# Patient Record
Sex: Male | Born: 1960
Health system: Southern US, Community
[De-identification: ages and names within clinical notes are randomized; demographics above are authoritative.]

## PROBLEM LIST (undated history)

## (undated) DIAGNOSIS — I10 Essential (primary) hypertension: Secondary | ICD-10-CM

## (undated) DIAGNOSIS — E785 Hyperlipidemia, unspecified: Secondary | ICD-10-CM

## (undated) DIAGNOSIS — G4730 Sleep apnea, unspecified: Secondary | ICD-10-CM

## (undated) HISTORY — DX: Hyperlipidemia, unspecified: E78.5

## (undated) HISTORY — PX: WISDOM TOOTH EXTRACTION: SHX21

## (undated) HISTORY — PX: HERNIA REPAIR: SHX51

## (undated) HISTORY — PX: OTOPLASTY: SUR998

---

## 2009-04-10 ENCOUNTER — Ambulatory Visit (HOSPITAL_COMMUNITY): Admission: RE | Admit: 2009-04-10 | Discharge: 2009-04-10 | Payer: Self-pay | Admitting: General Surgery

## 2010-08-22 LAB — DIFFERENTIAL
Basophils Absolute: 0 10*3/uL (ref 0.0–0.1)
Basophils Relative: 1 % (ref 0–1)
Eosinophils Absolute: 0.1 10*3/uL (ref 0.0–0.7)
Monocytes Absolute: 0.5 10*3/uL (ref 0.1–1.0)
Neutro Abs: 5.3 10*3/uL (ref 1.7–7.7)
Neutrophils Relative %: 75 % (ref 43–77)

## 2010-08-22 LAB — BASIC METABOLIC PANEL
CO2: 25 mEq/L (ref 19–32)
Calcium: 9.2 mg/dL (ref 8.4–10.5)
Chloride: 107 mEq/L (ref 96–112)
Creatinine, Ser: 1.06 mg/dL (ref 0.4–1.5)
Glucose, Bld: 108 mg/dL — ABNORMAL HIGH (ref 70–99)

## 2010-08-22 LAB — CBC
Hemoglobin: 17.2 g/dL — ABNORMAL HIGH (ref 13.0–17.0)
MCHC: 35.1 g/dL (ref 30.0–36.0)
MCV: 89 fL (ref 78.0–100.0)
RDW: 13.3 % (ref 11.5–15.5)

## 2016-01-29 MED FILL — LOSARTAN POTASSIUM 100 MG T: 100 | 90 days supply | Qty: 90 | Fill #0

## 2016-01-31 MED FILL — AMLODIPINE BESYLATE 10 MG T: 10 | 90 days supply | Qty: 90 | Fill #0

## 2016-02-01 MED FILL — FUROSEMIDE 20 MG TABLET: 20 | 90 days supply | Qty: 90 | Fill #0

## 2016-02-09 DIAGNOSIS — E291 Testicular hypofunction: Secondary | ICD-10-CM | POA: Diagnosis not present

## 2016-02-09 DIAGNOSIS — Z1159 Encounter for screening for other viral diseases: Secondary | ICD-10-CM | POA: Diagnosis not present

## 2016-02-09 DIAGNOSIS — Z87898 Personal history of other specified conditions: Secondary | ICD-10-CM | POA: Diagnosis not present

## 2016-02-09 DIAGNOSIS — I10 Essential (primary) hypertension: Secondary | ICD-10-CM | POA: Diagnosis not present

## 2016-02-09 DIAGNOSIS — E782 Mixed hyperlipidemia: Secondary | ICD-10-CM | POA: Diagnosis not present

## 2016-02-16 DIAGNOSIS — N400 Enlarged prostate without lower urinary tract symptoms: Secondary | ICD-10-CM | POA: Diagnosis not present

## 2016-02-16 DIAGNOSIS — Z6839 Body mass index (BMI) 39.0-39.9, adult: Secondary | ICD-10-CM | POA: Diagnosis not present

## 2016-02-16 DIAGNOSIS — Z23 Encounter for immunization: Secondary | ICD-10-CM | POA: Diagnosis not present

## 2016-02-16 DIAGNOSIS — R972 Elevated prostate specific antigen [PSA]: Secondary | ICD-10-CM | POA: Diagnosis not present

## 2016-02-16 DIAGNOSIS — N529 Male erectile dysfunction, unspecified: Secondary | ICD-10-CM | POA: Diagnosis not present

## 2016-02-16 DIAGNOSIS — E782 Mixed hyperlipidemia: Secondary | ICD-10-CM | POA: Diagnosis not present

## 2016-02-16 DIAGNOSIS — R609 Edema, unspecified: Secondary | ICD-10-CM | POA: Diagnosis not present

## 2016-02-16 DIAGNOSIS — I10 Essential (primary) hypertension: Secondary | ICD-10-CM | POA: Diagnosis not present

## 2016-02-19 DIAGNOSIS — N529 Male erectile dysfunction, unspecified: Secondary | ICD-10-CM | POA: Diagnosis not present

## 2016-02-19 DIAGNOSIS — E291 Testicular hypofunction: Secondary | ICD-10-CM | POA: Diagnosis not present

## 2016-02-19 DIAGNOSIS — Z6839 Body mass index (BMI) 39.0-39.9, adult: Secondary | ICD-10-CM | POA: Diagnosis not present

## 2016-02-19 DIAGNOSIS — N401 Enlarged prostate with lower urinary tract symptoms: Secondary | ICD-10-CM | POA: Diagnosis not present

## 2016-02-19 DIAGNOSIS — N138 Other obstructive and reflux uropathy: Secondary | ICD-10-CM | POA: Diagnosis not present

## 2016-03-21 MED FILL — METOPROLOL SUCC ER 200 MG T: 200 | 90 days supply | Qty: 90 | Fill #0

## 2016-03-21 MED FILL — PRAVASTATIN NA 40 MG TAB: 40 | 90 days supply | Qty: 90 | Fill #0

## 2016-03-21 MED FILL — FINASTERIDE 5 MG TABLET: 5 | 90 days supply | Qty: 90 | Fill #0

## 2016-03-27 DIAGNOSIS — H524 Presbyopia: Secondary | ICD-10-CM | POA: Diagnosis not present

## 2016-04-25 MED FILL — AMLODIPINE BESYLATE 10 MG T: 10 | 90 days supply | Qty: 90 | Fill #0

## 2016-04-25 MED FILL — LOSARTAN POTASSIUM 100 MG T: 100 | 90 days supply | Qty: 90 | Fill #0

## 2016-04-25 MED FILL — FUROSEMIDE 20 MG TABLET: 20 | 90 days supply | Qty: 90 | Fill #0

## 2016-05-14 DIAGNOSIS — M7041 Prepatellar bursitis, right knee: Secondary | ICD-10-CM | POA: Diagnosis not present

## 2016-06-20 MED FILL — PRAVASTATIN NA 40 MG TAB: 40 | 90 days supply | Qty: 90 | Fill #0

## 2016-06-21 DIAGNOSIS — E782 Mixed hyperlipidemia: Secondary | ICD-10-CM | POA: Diagnosis not present

## 2016-06-21 DIAGNOSIS — I10 Essential (primary) hypertension: Secondary | ICD-10-CM | POA: Diagnosis not present

## 2016-07-03 MED FILL — FINASTERIDE 5 MG TABLET: 5 | 90 days supply | Qty: 90 | Fill #0

## 2016-07-05 DIAGNOSIS — R972 Elevated prostate specific antigen [PSA]: Secondary | ICD-10-CM | POA: Diagnosis not present

## 2016-07-05 DIAGNOSIS — E782 Mixed hyperlipidemia: Secondary | ICD-10-CM | POA: Diagnosis not present

## 2016-07-05 DIAGNOSIS — R600 Localized edema: Secondary | ICD-10-CM | POA: Diagnosis not present

## 2016-07-05 DIAGNOSIS — N400 Enlarged prostate without lower urinary tract symptoms: Secondary | ICD-10-CM | POA: Diagnosis not present

## 2016-07-05 DIAGNOSIS — I10 Essential (primary) hypertension: Secondary | ICD-10-CM | POA: Diagnosis not present

## 2016-07-05 DIAGNOSIS — E291 Testicular hypofunction: Secondary | ICD-10-CM | POA: Diagnosis not present

## 2016-07-05 DIAGNOSIS — N529 Male erectile dysfunction, unspecified: Secondary | ICD-10-CM | POA: Diagnosis not present

## 2016-07-05 DIAGNOSIS — Z6839 Body mass index (BMI) 39.0-39.9, adult: Secondary | ICD-10-CM | POA: Diagnosis not present

## 2016-07-05 MED FILL — METOPROLOL SUCC ER 200 MG T: 200 | 90 days supply | Qty: 90 | Fill #0

## 2016-07-12 DIAGNOSIS — L821 Other seborrheic keratosis: Secondary | ICD-10-CM | POA: Diagnosis not present

## 2016-07-12 DIAGNOSIS — D485 Neoplasm of uncertain behavior of skin: Secondary | ICD-10-CM | POA: Diagnosis not present

## 2016-07-12 DIAGNOSIS — L918 Other hypertrophic disorders of the skin: Secondary | ICD-10-CM | POA: Diagnosis not present

## 2016-07-12 DIAGNOSIS — L820 Inflamed seborrheic keratosis: Secondary | ICD-10-CM | POA: Diagnosis not present

## 2016-07-12 DIAGNOSIS — L438 Other lichen planus: Secondary | ICD-10-CM | POA: Diagnosis not present

## 2016-07-23 MED FILL — LOSARTAN POTASSIUM 100 MG T: 100 | 90 days supply | Qty: 90 | Fill #1

## 2016-07-23 MED FILL — AMLODIPINE BESYLATE 10 MG T: 10 | 90 days supply | Qty: 90 | Fill #1

## 2016-07-23 MED FILL — FUROSEMIDE 20 MG TABLET: 20 | 90 days supply | Qty: 90 | Fill #1

## 2016-08-21 DIAGNOSIS — N400 Enlarged prostate without lower urinary tract symptoms: Secondary | ICD-10-CM | POA: Diagnosis not present

## 2016-08-28 DIAGNOSIS — N138 Other obstructive and reflux uropathy: Secondary | ICD-10-CM | POA: Diagnosis not present

## 2016-08-28 DIAGNOSIS — N402 Nodular prostate without lower urinary tract symptoms: Secondary | ICD-10-CM | POA: Diagnosis not present

## 2016-08-28 DIAGNOSIS — N401 Enlarged prostate with lower urinary tract symptoms: Secondary | ICD-10-CM | POA: Diagnosis not present

## 2016-08-28 DIAGNOSIS — E291 Testicular hypofunction: Secondary | ICD-10-CM | POA: Diagnosis not present

## 2016-09-18 MED FILL — PRAVASTATIN NA 40 MG TAB: 40 | 90 days supply | Qty: 90 | Fill #1

## 2016-10-03 MED FILL — FINASTERIDE 5 MG TABLET: 5 | 90 days supply | Qty: 90 | Fill #1

## 2016-10-03 MED FILL — METOPROLOL SUCC ER 200 MG T: 200 | 90 days supply | Qty: 90 | Fill #1

## 2016-10-22 MED FILL — FUROSEMIDE 20 MG TABLET: 20 | 90 days supply | Qty: 90 | Fill #0

## 2016-10-22 MED FILL — LOSARTAN POTASSIUM 100 MG T: 100 | 90 days supply | Qty: 90 | Fill #0

## 2016-10-22 MED FILL — AMLODIPINE BESYLATE 10 MG T: 10 | 90 days supply | Qty: 90 | Fill #0

## 2016-11-19 DIAGNOSIS — E782 Mixed hyperlipidemia: Secondary | ICD-10-CM | POA: Diagnosis not present

## 2016-11-19 DIAGNOSIS — I10 Essential (primary) hypertension: Secondary | ICD-10-CM | POA: Diagnosis not present

## 2016-11-25 DIAGNOSIS — R972 Elevated prostate specific antigen [PSA]: Secondary | ICD-10-CM | POA: Diagnosis not present

## 2016-11-25 DIAGNOSIS — E782 Mixed hyperlipidemia: Secondary | ICD-10-CM | POA: Diagnosis not present

## 2016-11-25 DIAGNOSIS — R600 Localized edema: Secondary | ICD-10-CM | POA: Diagnosis not present

## 2016-11-25 DIAGNOSIS — I10 Essential (primary) hypertension: Secondary | ICD-10-CM | POA: Diagnosis not present

## 2016-11-25 DIAGNOSIS — H6980 Other specified disorders of Eustachian tube, unspecified ear: Secondary | ICD-10-CM | POA: Diagnosis not present

## 2016-11-25 DIAGNOSIS — D751 Secondary polycythemia: Secondary | ICD-10-CM | POA: Diagnosis not present

## 2016-11-25 DIAGNOSIS — N400 Enlarged prostate without lower urinary tract symptoms: Secondary | ICD-10-CM | POA: Diagnosis not present

## 2016-11-25 DIAGNOSIS — H6591 Unspecified nonsuppurative otitis media, right ear: Secondary | ICD-10-CM | POA: Diagnosis not present

## 2016-11-25 MED FILL — AMOX TR-K CLV 875-125 MG TA: 875-125 | 10 days supply | Qty: 20 | Fill #0

## 2016-12-12 ENCOUNTER — Encounter (HOSPITAL_COMMUNITY): Payer: Self-pay | Admitting: *Deleted

## 2016-12-12 ENCOUNTER — Ambulatory Visit (HOSPITAL_COMMUNITY)
Admission: EM | Admit: 2016-12-12 | Discharge: 2016-12-12 | Disposition: A | Discharge status: Home / Self Care | Payer: 59

## 2016-12-12 DIAGNOSIS — H6691 Otitis media, unspecified, right ear: Secondary | ICD-10-CM | POA: Diagnosis not present

## 2016-12-12 HISTORY — DX: Essential (primary) hypertension: I10

## 2016-12-12 MED ORDER — CEFTRIAXONE SODIUM 1 G IJ SOLR
1.0000 g | Freq: Once | INTRAMUSCULAR | Status: AC
  Administered 2016-12-12: 1 g via INTRAMUSCULAR

## 2016-12-12 MED ORDER — LIDOCAINE HCL (PF) 1 % IJ SOLN
INTRAMUSCULAR | Status: AC
  Filled 2016-12-12: qty 2

## 2016-12-12 MED ORDER — CEFDINIR 300 MG PO CAPS: 300.0000 mg | ORAL_CAPSULE | Freq: Two times a day (BID) | ORAL | 0 refills | Status: AC

## 2016-12-12 MED ORDER — CEFTRIAXONE SODIUM 1 G IJ SOLR
INTRAMUSCULAR | Status: AC
  Filled 2016-12-12: qty 10

## 2016-12-12 NOTE — ED Provider Notes (Signed)
CSN: [id number]     Arrival date & time 12/12/16  1610 History   First MD Initiated Contact with Patient 12/12/16 1647     Chief Complaint  Patient presents with  . Otalgia   (Consider location/radiation/quality/duration/timing/severity/associated sxs/prior Treatment) Subjective:   Richard Farley is a 56 y.o. male who presents for possible ear infection. Symptoms include right ear pain. Onset of symptoms was 6 weeks ago and has been unchanged since that time. Associated symptoms include generalized achiness and headache. He is drinking plenty of fluids. Notably, the patient was evaluated by his PCP several weeks ago for the same. He has completed a course of Augmentin about 2 weeks ago. The patient's symptoms still persist. He was referred to an ENT specialist but his appointment isn't until August 13. He denies any fevers, sweats, chills, rhinorrhea, congestion or sore throat.   The following portions of the patient's history were reviewed and updated as appropriate: allergies, current medications, past family history, past medical history, past social history, past surgical history and problem list.          Past Medical History:  Diagnosis Date  . Hypertension    Past Surgical History:  Procedure Laterality Date  . HERNIA REPAIR     History reviewed. No pertinent family history. Social History  Substance Use Topics  . Smoking status: Never Smoker  . Smokeless tobacco: Never Used  . Alcohol use Yes    Review of Systems  Constitutional: Negative for chills, diaphoresis and fever.  HENT: Positive for ear pain. Negative for ear discharge.   Gastrointestinal: Negative for nausea and vomiting.  Neurological: Positive for headaches.  All other systems reviewed and are negative.   Allergies  Crestor [rosuvastatin calcium]  Home Medications   Prior to Admission medications   Medication Sig Start Date End Date Taking? Authorizing Provider  AMLODIPINE BESYLATE PO Take  by mouth.   Yes [provider]  Furosemide (LASIX PO) Take by mouth.   Yes [provider]  LOSARTAN POTASSIUM PO Take by mouth.   Yes [provider]  cefdinir (OMNICEF) 300 MG capsule Take 1 capsule (300 mg total) by mouth 2 (two) times daily. 12/12/16 12/22/16  Enrique Sack, FNP   Meds Ordered and Administered this Visit   Medications  cefTRIAXone (ROCEPHIN) injection 1 g (not administered)    BP (!) 152/90 (BP Location: Right Arm)   Pulse 60   Temp 98.8 F (37.1 C) (Oral)   Resp 16   SpO2 96%  No data found.   Physical Exam  Constitutional: He is oriented to person, place, and time. He appears well-developed and well-nourished.  HENT:  Head: Normocephalic.  Left Ear: External ear normal.  Nose: Nose normal.  Mouth/Throat: Oropharynx is clear and moist. No oropharyngeal exudate.  External auditory canals.Tympanic membrane redness present with poor mobility. Hearing grossly intact.   Eyes: Pupils are equal, round, and reactive to light. Conjunctivae and EOM are normal.  Neck: Normal range of motion. Neck supple.  Cardiovascular: Normal rate and regular rhythm.   Pulmonary/Chest: Effort normal and breath sounds normal.  Musculoskeletal: Normal range of motion.  Lymphadenopathy:    He has no cervical adenopathy.  Neurological: He is alert and oriented to person, place, and time.  Skin: Skin is warm and dry.  Psychiatric: He has a normal mood and affect.    Urgent Care Course     Procedures (including critical care time)  Labs Review Labs Reviewed - No data to display  Imaging Review No results found.   Visual Acuity Review  Right Eye Distance:   Left Eye Distance:   Bilateral Distance:    Right Eye Near:   Left Eye Near:    Bilateral Near:         MDM   1. Otitis media not resolved, right    Plan: 1. 1 gram of Rocephin IM in the office today  2. Start Cefdinir 300 mg BID x 10 days  3. Take OTC probiotic while on  antibiotics  4. OTC analgesics, fluids and rest.  5.  Follow-up with the ENT specialist on August 13 as already scheduled.   Discussed diagnosis and treatment with patient. All questions have been answered and all concerns have been addressed. The patient verbalized understanding and had no further questions     Enrique Sack, [place] 12/12/16 1706

## 2016-12-12 NOTE — ED Triage Notes (Signed)
Pt    Reports  r  Earache  X   6   Weeks     Took  A   course  Of  augmentin  And  Did  Not   Work   Also  Reports  Body  Aches  And  Headache   With  Some  Fatigue

## 2016-12-12 NOTE — Discharge Instructions (Signed)
You were given an antibiotic injection in the clinic today. You should start your oral antibiotics in the morning. Take this twice daily for 10 days. You should also pick up a probiotic (of your choice) from the pharmacy and take as directed on the label while you are on the antibiotics. You can take tylenol or ibuprofen as needed for pain or fever. Follow-up with the ENT specialist on August 13 as already scheduled.

## 2016-12-20 DIAGNOSIS — G4733 Obstructive sleep apnea (adult) (pediatric): Secondary | ICD-10-CM | POA: Diagnosis not present

## 2016-12-20 MED FILL — PRAVASTATIN NA 40 MG TAB: 40 | 90 days supply | Qty: 90 | Fill #0

## 2016-12-26 MED FILL — FINASTERIDE 5 MG TABLET: 5 | 90 days supply | Qty: 90 | Fill #2

## 2016-12-30 DIAGNOSIS — Z9989 Dependence on other enabling machines and devices: Secondary | ICD-10-CM | POA: Diagnosis not present

## 2016-12-30 DIAGNOSIS — H6501 Acute serous otitis media, right ear: Secondary | ICD-10-CM | POA: Diagnosis not present

## 2016-12-30 DIAGNOSIS — Z6841 Body Mass Index (BMI) 40.0 and over, adult: Secondary | ICD-10-CM | POA: Diagnosis not present

## 2016-12-30 DIAGNOSIS — H6981 Other specified disorders of Eustachian tube, right ear: Secondary | ICD-10-CM | POA: Diagnosis not present

## 2016-12-30 DIAGNOSIS — G4733 Obstructive sleep apnea (adult) (pediatric): Secondary | ICD-10-CM | POA: Diagnosis not present

## 2016-12-30 MED FILL — predniSONE 20 MG TABS: 20 | 13 days supply | Qty: 30 | Fill #0

## 2016-12-30 MED FILL — FLUTICASONE PROP 50 MCG SPR: 50 | 30 days supply | Qty: 16 | Fill #0

## 2016-12-31 MED FILL — METOPROLOL SUCC ER 200 MG T: 200 | 90 days supply | Qty: 90 | Fill #0

## 2017-01-08 DIAGNOSIS — G4733 Obstructive sleep apnea (adult) (pediatric): Secondary | ICD-10-CM | POA: Diagnosis not present

## 2017-01-13 DIAGNOSIS — H6981 Other specified disorders of Eustachian tube, right ear: Secondary | ICD-10-CM | POA: Diagnosis not present

## 2017-01-13 DIAGNOSIS — H6521 Chronic serous otitis media, right ear: Secondary | ICD-10-CM | POA: Diagnosis not present

## 2017-01-13 HISTORY — PX: TYMPANOSTOMY TUBE PLACEMENT: SHX32

## 2017-01-13 MED FILL — NYSTATIN 100,000 UNITS/ML S: 100000 | 7 days supply | Qty: 105 | Fill #0

## 2017-01-13 MED FILL — OFLOXACIN 0.3% EYE DROPS: 0.3 | 7 days supply | Qty: 10 | Fill #0

## 2017-01-21 MED FILL — AMLODIPINE BESYLATE 10 MG T: 10 | 90 days supply | Qty: 90 | Fill #1

## 2017-01-21 MED FILL — FUROSEMIDE 20 MG TAB: 20 | 90 days supply | Qty: 90 | Fill #1

## 2017-01-21 MED FILL — LOSARTAN POTASSIUM 100 MG T: 100 | 90 days supply | Qty: 90 | Fill #1

## 2017-01-28 DIAGNOSIS — H65111 Acute and subacute allergic otitis media (mucoid) (sanguinous) (serous), right ear: Secondary | ICD-10-CM | POA: Diagnosis not present

## 2017-01-29 MED FILL — NEOMYCIN-POLYMYXIN-HC EAR S: 3.5-10000-1 | 7 days supply | Qty: 10 | Fill #0

## 2017-02-08 DIAGNOSIS — G4733 Obstructive sleep apnea (adult) (pediatric): Secondary | ICD-10-CM | POA: Diagnosis not present

## 2017-02-18 DIAGNOSIS — Z9989 Dependence on other enabling machines and devices: Secondary | ICD-10-CM | POA: Diagnosis not present

## 2017-02-18 DIAGNOSIS — G4733 Obstructive sleep apnea (adult) (pediatric): Secondary | ICD-10-CM | POA: Diagnosis not present

## 2017-02-18 DIAGNOSIS — H6981 Other specified disorders of Eustachian tube, right ear: Secondary | ICD-10-CM | POA: Diagnosis not present

## 2017-03-10 DIAGNOSIS — G4733 Obstructive sleep apnea (adult) (pediatric): Secondary | ICD-10-CM | POA: Diagnosis not present

## 2017-03-24 MED FILL — FINASTERIDE 5 MG TABLET: 5 | 90 days supply | Qty: 90 | Fill #3

## 2017-03-24 MED FILL — PRAVASTATIN NA 40 MG TAB: 40 | 90 days supply | Qty: 90 | Fill #1

## 2017-03-28 DIAGNOSIS — E782 Mixed hyperlipidemia: Secondary | ICD-10-CM | POA: Diagnosis not present

## 2017-03-28 DIAGNOSIS — E291 Testicular hypofunction: Secondary | ICD-10-CM | POA: Diagnosis not present

## 2017-03-28 DIAGNOSIS — I10 Essential (primary) hypertension: Secondary | ICD-10-CM | POA: Diagnosis not present

## 2017-03-31 DIAGNOSIS — E291 Testicular hypofunction: Secondary | ICD-10-CM | POA: Diagnosis not present

## 2017-03-31 DIAGNOSIS — N400 Enlarged prostate without lower urinary tract symptoms: Secondary | ICD-10-CM | POA: Diagnosis not present

## 2017-03-31 DIAGNOSIS — N529 Male erectile dysfunction, unspecified: Secondary | ICD-10-CM | POA: Diagnosis not present

## 2017-04-03 MED FILL — METOPROLOL SUCC ER 200 MG T: 200 | 90 days supply | Qty: 90 | Fill #1

## 2017-04-04 DIAGNOSIS — G4733 Obstructive sleep apnea (adult) (pediatric): Secondary | ICD-10-CM | POA: Diagnosis not present

## 2017-04-04 DIAGNOSIS — Z23 Encounter for immunization: Secondary | ICD-10-CM | POA: Diagnosis not present

## 2017-04-04 DIAGNOSIS — N400 Enlarged prostate without lower urinary tract symptoms: Secondary | ICD-10-CM | POA: Diagnosis not present

## 2017-04-04 DIAGNOSIS — R600 Localized edema: Secondary | ICD-10-CM | POA: Diagnosis not present

## 2017-04-04 DIAGNOSIS — Z9989 Dependence on other enabling machines and devices: Secondary | ICD-10-CM | POA: Diagnosis not present

## 2017-04-04 DIAGNOSIS — E785 Hyperlipidemia, unspecified: Secondary | ICD-10-CM | POA: Diagnosis not present

## 2017-04-04 DIAGNOSIS — I10 Essential (primary) hypertension: Secondary | ICD-10-CM | POA: Diagnosis not present

## 2017-04-04 DIAGNOSIS — R972 Elevated prostate specific antigen [PSA]: Secondary | ICD-10-CM | POA: Diagnosis not present

## 2017-04-10 DIAGNOSIS — G4733 Obstructive sleep apnea (adult) (pediatric): Secondary | ICD-10-CM | POA: Diagnosis not present

## 2017-04-14 DIAGNOSIS — G4733 Obstructive sleep apnea (adult) (pediatric): Secondary | ICD-10-CM | POA: Diagnosis not present

## 2017-04-22 MED FILL — LOSARTAN POTASSIUM 100 MG T: 100 | 90 days supply | Qty: 90 | Fill #0

## 2017-04-22 MED FILL — AMLODIPINE BESYLATE 10 MG T: 10 | 90 days supply | Qty: 90 | Fill #0

## 2017-04-22 MED FILL — FUROSEMIDE 20 MG TABLET: 20 | 90 days supply | Qty: 90 | Fill #0

## 2017-07-01 MED FILL — METOPROLOL SUCC ER 200 MG T: 200 | 90 days supply | Qty: 90 | Fill #0

## 2017-07-01 MED FILL — PRAVASTATIN NA 40 MG TAB: 40 | 90 days supply | Qty: 90 | Fill #0

## 2017-07-01 MED FILL — FINASTERIDE 5 MG TABLET: 5 | 90 days supply | Qty: 90 | Fill #0

## 2017-07-22 MED FILL — FUROSEMIDE 20 MG TABS: 20 | 90 days supply | Qty: 90 | Fill #1

## 2017-07-22 MED FILL — LOSARTAN POTASSIUM 100 MG T: 100 | 90 days supply | Qty: 90 | Fill #1

## 2017-07-22 MED FILL — AMLODIPINE BESYLATE 10 MG T: 10 | 90 days supply | Qty: 90 | Fill #1

## 2017-07-23 DIAGNOSIS — G4733 Obstructive sleep apnea (adult) (pediatric): Secondary | ICD-10-CM | POA: Diagnosis not present

## 2017-08-08 DIAGNOSIS — E782 Mixed hyperlipidemia: Secondary | ICD-10-CM | POA: Diagnosis not present

## 2017-08-08 DIAGNOSIS — I10 Essential (primary) hypertension: Secondary | ICD-10-CM | POA: Diagnosis not present

## 2017-08-11 MED FILL — POTASSIUM CL ER 10 MEQ TABL: 10 | 90 days supply | Qty: 90 | Fill #0

## 2017-08-13 DIAGNOSIS — R972 Elevated prostate specific antigen [PSA]: Secondary | ICD-10-CM | POA: Diagnosis not present

## 2017-08-13 DIAGNOSIS — N529 Male erectile dysfunction, unspecified: Secondary | ICD-10-CM | POA: Diagnosis not present

## 2017-08-13 DIAGNOSIS — I872 Venous insufficiency (chronic) (peripheral): Secondary | ICD-10-CM | POA: Diagnosis not present

## 2017-08-13 DIAGNOSIS — I10 Essential (primary) hypertension: Secondary | ICD-10-CM | POA: Diagnosis not present

## 2017-08-13 DIAGNOSIS — E782 Mixed hyperlipidemia: Secondary | ICD-10-CM | POA: Diagnosis not present

## 2017-08-13 DIAGNOSIS — E291 Testicular hypofunction: Secondary | ICD-10-CM | POA: Diagnosis not present

## 2017-08-13 DIAGNOSIS — N400 Enlarged prostate without lower urinary tract symptoms: Secondary | ICD-10-CM | POA: Diagnosis not present

## 2017-08-13 DIAGNOSIS — E876 Hypokalemia: Secondary | ICD-10-CM | POA: Diagnosis not present

## 2017-08-20 DIAGNOSIS — H6981 Other specified disorders of Eustachian tube, right ear: Secondary | ICD-10-CM | POA: Diagnosis not present

## 2017-08-20 DIAGNOSIS — H6504 Acute serous otitis media, recurrent, right ear: Secondary | ICD-10-CM | POA: Diagnosis not present

## 2017-08-20 MED FILL — predniSONE 20 MG TABS: 20 | 13 days supply | Qty: 30 | Fill #0

## 2017-08-21 MED FILL — FLUTICASONE PROP 50 MCG SPR: 50 | 30 days supply | Qty: 16 | Fill #1

## 2017-09-10 DIAGNOSIS — G4733 Obstructive sleep apnea (adult) (pediatric): Secondary | ICD-10-CM | POA: Diagnosis not present

## 2017-09-11 MED FILL — NEO/POLYMYXIN/HC EAR SOLN: 3.5-10000-1 | 25 days supply | Qty: 10 | Fill #0

## 2017-09-22 DIAGNOSIS — E291 Testicular hypofunction: Secondary | ICD-10-CM | POA: Diagnosis not present

## 2017-09-22 DIAGNOSIS — N400 Enlarged prostate without lower urinary tract symptoms: Secondary | ICD-10-CM | POA: Diagnosis not present

## 2017-09-29 DIAGNOSIS — E291 Testicular hypofunction: Secondary | ICD-10-CM | POA: Diagnosis not present

## 2017-09-29 DIAGNOSIS — R972 Elevated prostate specific antigen [PSA]: Secondary | ICD-10-CM | POA: Diagnosis not present

## 2017-09-29 DIAGNOSIS — N400 Enlarged prostate without lower urinary tract symptoms: Secondary | ICD-10-CM | POA: Diagnosis not present

## 2017-09-30 MED FILL — METOPROLOL SUCCINATE ER 200: 200 | 90 days supply | Qty: 90 | Fill #0

## 2017-09-30 MED FILL — FINASTERIDE 5 MG TABLET: 5 | 90 days supply | Qty: 90 | Fill #1

## 2017-09-30 MED FILL — PRAVASTATIN NA 40 MG TAB: 40 | 90 days supply | Qty: 90 | Fill #0

## 2017-10-03 DIAGNOSIS — R972 Elevated prostate specific antigen [PSA]: Secondary | ICD-10-CM | POA: Diagnosis not present

## 2017-10-09 DIAGNOSIS — E291 Testicular hypofunction: Secondary | ICD-10-CM | POA: Diagnosis not present

## 2017-10-09 DIAGNOSIS — R972 Elevated prostate specific antigen [PSA]: Secondary | ICD-10-CM | POA: Diagnosis not present

## 2017-10-09 DIAGNOSIS — N400 Enlarged prostate without lower urinary tract symptoms: Secondary | ICD-10-CM | POA: Diagnosis not present

## 2017-10-20 MED FILL — LOSARTAN POTASSIUM 100 MG T: 100 | 90 days supply | Qty: 90 | Fill #0

## 2017-10-20 MED FILL — AMLODIPINE BESYLATE 10 MG T: 10 | 90 days supply | Qty: 90 | Fill #0

## 2017-10-20 MED FILL — FUROSEMIDE 20 MG TABS: 20 | 90 days supply | Qty: 90 | Fill #0

## 2017-11-24 MED FILL — POTASSIUM CL ER 10 MEQ TABL: 10 | 90 days supply | Qty: 90 | Fill #1

## 2017-12-23 DIAGNOSIS — H524 Presbyopia: Secondary | ICD-10-CM | POA: Diagnosis not present

## 2017-12-25 MED FILL — FINASTERIDE 5 MG TABLET: 5 | 90 days supply | Qty: 90 | Fill #2

## 2017-12-25 MED FILL — METOPROLOL SUCCINATE ER 200: 200 | 90 days supply | Qty: 90 | Fill #0

## 2017-12-25 MED FILL — PRAVASTATIN NA 40 MG TAB: 40 | 90 days supply | Qty: 90 | Fill #0

## 2018-01-21 MED FILL — LOSARTAN POTASSIUM 100 MG T: 100 | 30 days supply | Qty: 30 | Fill #1

## 2018-01-21 MED FILL — AMLODIPINE BESYLATE 10 MG T: 10 | 90 days supply | Qty: 90 | Fill #1

## 2018-01-21 MED FILL — FUROSEMIDE 20 MG TABS: 20 | 90 days supply | Qty: 90 | Fill #1

## 2018-01-26 DIAGNOSIS — E782 Mixed hyperlipidemia: Secondary | ICD-10-CM | POA: Diagnosis not present

## 2018-01-26 DIAGNOSIS — I10 Essential (primary) hypertension: Secondary | ICD-10-CM | POA: Diagnosis not present

## 2018-02-02 DIAGNOSIS — I10 Essential (primary) hypertension: Secondary | ICD-10-CM | POA: Diagnosis not present

## 2018-02-02 DIAGNOSIS — E291 Testicular hypofunction: Secondary | ICD-10-CM | POA: Diagnosis not present

## 2018-02-02 DIAGNOSIS — N139 Obstructive and reflux uropathy, unspecified: Secondary | ICD-10-CM | POA: Diagnosis not present

## 2018-02-02 DIAGNOSIS — N529 Male erectile dysfunction, unspecified: Secondary | ICD-10-CM | POA: Diagnosis not present

## 2018-02-02 DIAGNOSIS — G4733 Obstructive sleep apnea (adult) (pediatric): Secondary | ICD-10-CM | POA: Diagnosis not present

## 2018-02-02 DIAGNOSIS — E785 Hyperlipidemia, unspecified: Secondary | ICD-10-CM | POA: Diagnosis not present

## 2018-02-02 DIAGNOSIS — Z23 Encounter for immunization: Secondary | ICD-10-CM | POA: Diagnosis not present

## 2018-02-02 DIAGNOSIS — N401 Enlarged prostate with lower urinary tract symptoms: Secondary | ICD-10-CM | POA: Diagnosis not present

## 2018-02-02 MED FILL — POTASSIUM CL ER 20 MEQ TABL: 20 | 90 days supply | Qty: 90 | Fill #0

## 2018-02-11 DIAGNOSIS — E291 Testicular hypofunction: Secondary | ICD-10-CM | POA: Diagnosis not present

## 2018-02-11 DIAGNOSIS — R972 Elevated prostate specific antigen [PSA]: Secondary | ICD-10-CM | POA: Diagnosis not present

## 2018-02-19 MED FILL — LOSARTAN POTASSIUM 100 MG T: 100 | 30 days supply | Qty: 30 | Fill #2

## 2018-02-25 DIAGNOSIS — E291 Testicular hypofunction: Secondary | ICD-10-CM | POA: Diagnosis not present

## 2018-02-25 DIAGNOSIS — R972 Elevated prostate specific antigen [PSA]: Secondary | ICD-10-CM | POA: Diagnosis not present

## 2018-02-25 DIAGNOSIS — N400 Enlarged prostate without lower urinary tract symptoms: Secondary | ICD-10-CM | POA: Diagnosis not present

## 2018-03-18 DIAGNOSIS — G4733 Obstructive sleep apnea (adult) (pediatric): Secondary | ICD-10-CM | POA: Diagnosis not present

## 2018-03-23 MED FILL — FINASTERIDE 5 MG TABLET: 5 | 90 days supply | Qty: 90 | Fill #3

## 2018-03-23 MED FILL — LOSARTAN POTASSIUM 100 MG T: 100 | 30 days supply | Qty: 30 | Fill #3

## 2018-03-23 MED FILL — PRAVASTATIN NA 40 MG TAB: 40 | 90 days supply | Qty: 90 | Fill #1

## 2018-03-24 MED FILL — METOPROLOL SUCCINATE ER 200: 200 | 90 days supply | Qty: 90 | Fill #1

## 2018-04-15 MED FILL — AMLODIPINE BESYLATE 10 MG T: 10 | 90 days supply | Qty: 90 | Fill #0

## 2018-04-15 MED FILL — FUROSEMIDE 20 MG TABS: 20 | 90 days supply | Qty: 90 | Fill #0

## 2018-04-17 MED FILL — LOSARTAN POTASSIUM 100 MG T: 100 | 30 days supply | Qty: 30 | Fill #0

## 2018-05-18 MED FILL — LOSARTAN POTASSIUM 100 MG T: 100 | 30 days supply | Qty: 30 | Fill #1

## 2018-05-18 MED FILL — POTASSIUM CHLORIDE CRYS ER: 20 | 90 days supply | Qty: 90 | Fill #1

## 2018-05-28 DIAGNOSIS — G4733 Obstructive sleep apnea (adult) (pediatric): Secondary | ICD-10-CM | POA: Diagnosis not present

## 2018-06-05 DIAGNOSIS — Z131 Encounter for screening for diabetes mellitus: Secondary | ICD-10-CM | POA: Diagnosis not present

## 2018-06-05 DIAGNOSIS — Z13220 Encounter for screening for lipoid disorders: Secondary | ICD-10-CM | POA: Diagnosis not present

## 2018-06-05 DIAGNOSIS — E782 Mixed hyperlipidemia: Secondary | ICD-10-CM | POA: Diagnosis not present

## 2018-06-12 DIAGNOSIS — E782 Mixed hyperlipidemia: Secondary | ICD-10-CM | POA: Diagnosis not present

## 2018-06-12 DIAGNOSIS — D751 Secondary polycythemia: Secondary | ICD-10-CM | POA: Diagnosis not present

## 2018-06-12 DIAGNOSIS — Z0000 Encounter for general adult medical examination without abnormal findings: Secondary | ICD-10-CM | POA: Diagnosis not present

## 2018-06-12 DIAGNOSIS — E876 Hypokalemia: Secondary | ICD-10-CM | POA: Diagnosis not present

## 2018-06-12 DIAGNOSIS — I10 Essential (primary) hypertension: Secondary | ICD-10-CM | POA: Diagnosis not present

## 2018-06-12 DIAGNOSIS — N529 Male erectile dysfunction, unspecified: Secondary | ICD-10-CM | POA: Diagnosis not present

## 2018-06-12 DIAGNOSIS — N400 Enlarged prostate without lower urinary tract symptoms: Secondary | ICD-10-CM | POA: Diagnosis not present

## 2018-06-12 DIAGNOSIS — G4733 Obstructive sleep apnea (adult) (pediatric): Secondary | ICD-10-CM | POA: Diagnosis not present

## 2018-06-12 DIAGNOSIS — E291 Testicular hypofunction: Secondary | ICD-10-CM | POA: Diagnosis not present

## 2018-06-12 DIAGNOSIS — I872 Venous insufficiency (chronic) (peripheral): Secondary | ICD-10-CM | POA: Diagnosis not present

## 2018-06-22 MED FILL — LOSARTAN POTASSIUM 100 MG T: 100 | 30 days supply | Qty: 30 | Fill #2

## 2018-06-29 MED FILL — PRAVASTATIN NA 40 MG TAB: 40 | 90 days supply | Qty: 90 | Fill #0

## 2018-06-29 MED FILL — METOPROLOL SUCCINATE ER 200: 200 | 90 days supply | Qty: 90 | Fill #0

## 2018-06-29 MED FILL — FINASTERIDE 5 MG TABLET: 5 | 90 days supply | Qty: 90 | Fill #0

## 2018-07-21 MED FILL — FUROSEMIDE 20 MG TABS: 20 | 90 days supply | Qty: 90 | Fill #1

## 2018-07-21 MED FILL — LOSARTAN POTASSIUM 100 MG T: 100 | 30 days supply | Qty: 30 | Fill #3

## 2018-07-21 MED FILL — AMLODIPINE BESYLATE 10 MG T: 10 | 90 days supply | Qty: 90 | Fill #1

## 2018-08-14 MED FILL — POTASSIUM CHLORIDE CRYS ER: 20 | 90 days supply | Qty: 90 | Fill #0

## 2018-08-14 MED FILL — LOSARTAN POTASSIUM 100 MG T: 100 | 30 days supply | Qty: 30 | Fill #4

## 2018-09-17 MED FILL — LOSARTAN POTASSIUM 100 MG T: 100 | 30 days supply | Qty: 30 | Fill #5

## 2018-09-23 MED FILL — PRAVASTATIN NA 40 MG TAB: 40 | 90 days supply | Qty: 90 | Fill #1

## 2018-09-23 MED FILL — FINASTERIDE 5 MG TABLET: 5 | 90 days supply | Qty: 90 | Fill #1

## 2018-09-23 MED FILL — METOPROLOL SUCCINATE ER 200: 200 | 90 days supply | Qty: 90 | Fill #1

## 2018-10-01 DIAGNOSIS — R739 Hyperglycemia, unspecified: Secondary | ICD-10-CM | POA: Diagnosis not present

## 2018-10-01 DIAGNOSIS — I10 Essential (primary) hypertension: Secondary | ICD-10-CM | POA: Diagnosis not present

## 2018-10-01 DIAGNOSIS — E782 Mixed hyperlipidemia: Secondary | ICD-10-CM | POA: Diagnosis not present

## 2018-10-07 DIAGNOSIS — N400 Enlarged prostate without lower urinary tract symptoms: Secondary | ICD-10-CM | POA: Diagnosis not present

## 2018-10-07 DIAGNOSIS — E782 Mixed hyperlipidemia: Secondary | ICD-10-CM | POA: Diagnosis not present

## 2018-10-07 DIAGNOSIS — I10 Essential (primary) hypertension: Secondary | ICD-10-CM | POA: Diagnosis not present

## 2018-10-07 DIAGNOSIS — G4733 Obstructive sleep apnea (adult) (pediatric): Secondary | ICD-10-CM | POA: Diagnosis not present

## 2018-10-07 DIAGNOSIS — R739 Hyperglycemia, unspecified: Secondary | ICD-10-CM | POA: Diagnosis not present

## 2018-10-07 DIAGNOSIS — E291 Testicular hypofunction: Secondary | ICD-10-CM | POA: Diagnosis not present

## 2018-10-07 DIAGNOSIS — R972 Elevated prostate specific antigen [PSA]: Secondary | ICD-10-CM | POA: Diagnosis not present

## 2018-10-07 DIAGNOSIS — N529 Male erectile dysfunction, unspecified: Secondary | ICD-10-CM | POA: Diagnosis not present

## 2018-10-08 DIAGNOSIS — G4733 Obstructive sleep apnea (adult) (pediatric): Secondary | ICD-10-CM | POA: Diagnosis not present

## 2018-10-14 MED FILL — FUROSEMIDE 20 MG TABS: 20 | 90 days supply | Qty: 90 | Fill #0

## 2018-10-14 MED FILL — AMLODIPINE BESYLATE 10 MG T: 10 | 90 days supply | Qty: 90 | Fill #0

## 2018-10-14 MED FILL — LOSARTAN POTASSIUM 100 MG T: 100 | 30 days supply | Qty: 30 | Fill #0

## 2018-10-23 DIAGNOSIS — R609 Edema, unspecified: Secondary | ICD-10-CM | POA: Diagnosis not present

## 2018-10-23 DIAGNOSIS — I872 Venous insufficiency (chronic) (peripheral): Secondary | ICD-10-CM | POA: Diagnosis not present

## 2018-10-23 MED FILL — TRIAMCINOLONE 0.1% CREAM: 0.1 | 14 days supply | Qty: 80 | Fill #0

## 2018-11-03 MED FILL — TRIAMCINOLONE 0.1% CREAM: 0.1 | 14 days supply | Qty: 80 | Fill #1

## 2018-11-07 MED FILL — LOSARTAN POTASSIUM 100 MG T: 100 | 30 days supply | Qty: 30 | Fill #1

## 2018-11-09 MED FILL — POTASSIUM CHLORIDE CRYS ER: 20 | 90 days supply | Qty: 90 | Fill #1

## 2018-11-10 DIAGNOSIS — I8312 Varicose veins of left lower extremity with inflammation: Secondary | ICD-10-CM | POA: Diagnosis not present

## 2018-11-10 DIAGNOSIS — I8311 Varicose veins of right lower extremity with inflammation: Secondary | ICD-10-CM | POA: Diagnosis not present

## 2018-11-10 DIAGNOSIS — R600 Localized edema: Secondary | ICD-10-CM | POA: Diagnosis not present

## 2018-11-10 DIAGNOSIS — I8002 Phlebitis and thrombophlebitis of superficial vessels of left lower extremity: Secondary | ICD-10-CM | POA: Diagnosis not present

## 2018-11-18 DIAGNOSIS — G4733 Obstructive sleep apnea (adult) (pediatric): Secondary | ICD-10-CM | POA: Diagnosis not present

## 2018-12-02 DIAGNOSIS — I8002 Phlebitis and thrombophlebitis of superficial vessels of left lower extremity: Secondary | ICD-10-CM | POA: Diagnosis not present

## 2018-12-02 DIAGNOSIS — R600 Localized edema: Secondary | ICD-10-CM | POA: Diagnosis not present

## 2018-12-07 MED FILL — LOSARTAN POTASSIUM 100 MG T: 100 | 30 days supply | Qty: 30 | Fill #2

## 2018-12-09 DIAGNOSIS — I8312 Varicose veins of left lower extremity with inflammation: Secondary | ICD-10-CM | POA: Diagnosis not present

## 2018-12-20 MED FILL — FINASTERIDE 5 MG TABLET: 5 | 90 days supply | Qty: 90 | Fill #2

## 2018-12-21 MED FILL — PRAVASTATIN NA 40 MG TAB: 40 | 90 days supply | Qty: 90 | Fill #0

## 2018-12-21 MED FILL — METOPROLOL SUCCINATE ER 200: 200 | 90 days supply | Qty: 90 | Fill #0

## 2018-12-23 DIAGNOSIS — I8312 Varicose veins of left lower extremity with inflammation: Secondary | ICD-10-CM | POA: Diagnosis not present

## 2018-12-23 DIAGNOSIS — I8002 Phlebitis and thrombophlebitis of superficial vessels of left lower extremity: Secondary | ICD-10-CM | POA: Diagnosis not present

## 2018-12-23 DIAGNOSIS — R600 Localized edema: Secondary | ICD-10-CM | POA: Diagnosis not present

## 2019-01-05 MED FILL — ALPRAZolam 0.5 MG TABS: 0.5 | 4 days supply | Qty: 4 | Fill #0

## 2019-01-06 MED FILL — AMLODIPINE BESYLATE 10 MG T: 10 | 90 days supply | Qty: 90 | Fill #1

## 2019-01-06 MED FILL — FUROSEMIDE 20 MG TABS: 20 | 90 days supply | Qty: 90 | Fill #1

## 2019-01-06 MED FILL — LOSARTAN POTASSIUM 100 MG T: 100 | 30 days supply | Qty: 30 | Fill #3

## 2019-01-12 DIAGNOSIS — I8311 Varicose veins of right lower extremity with inflammation: Secondary | ICD-10-CM | POA: Diagnosis not present

## 2019-01-15 DIAGNOSIS — I8312 Varicose veins of left lower extremity with inflammation: Secondary | ICD-10-CM | POA: Diagnosis not present

## 2019-01-19 DIAGNOSIS — I8311 Varicose veins of right lower extremity with inflammation: Secondary | ICD-10-CM | POA: Diagnosis not present

## 2019-01-22 DIAGNOSIS — I8311 Varicose veins of right lower extremity with inflammation: Secondary | ICD-10-CM | POA: Diagnosis not present

## 2019-01-22 DIAGNOSIS — I8312 Varicose veins of left lower extremity with inflammation: Secondary | ICD-10-CM | POA: Diagnosis not present

## 2019-02-05 MED FILL — LOSARTAN POTASSIUM 100 MG T: 100 | 30 days supply | Qty: 30 | Fill #4

## 2019-02-09 DIAGNOSIS — I8311 Varicose veins of right lower extremity with inflammation: Secondary | ICD-10-CM | POA: Diagnosis not present

## 2019-02-09 DIAGNOSIS — G4733 Obstructive sleep apnea (adult) (pediatric): Secondary | ICD-10-CM | POA: Diagnosis not present

## 2019-02-10 MED FILL — POTASSIUM CHLORIDE CRYS ER: 20 | 90 days supply | Qty: 90 | Fill #0

## 2019-02-12 DIAGNOSIS — I10 Essential (primary) hypertension: Secondary | ICD-10-CM | POA: Diagnosis not present

## 2019-02-12 DIAGNOSIS — E782 Mixed hyperlipidemia: Secondary | ICD-10-CM | POA: Diagnosis not present

## 2019-02-16 DIAGNOSIS — H524 Presbyopia: Secondary | ICD-10-CM | POA: Diagnosis not present

## 2019-02-22 ENCOUNTER — Other Ambulatory Visit: Payer: Self-pay

## 2019-02-22 DIAGNOSIS — Z20822 Contact with and (suspected) exposure to covid-19: Secondary | ICD-10-CM

## 2019-02-22 DIAGNOSIS — Z20828 Contact with and (suspected) exposure to other viral communicable diseases: Secondary | ICD-10-CM | POA: Diagnosis not present

## 2019-02-23 DIAGNOSIS — I8312 Varicose veins of left lower extremity with inflammation: Secondary | ICD-10-CM | POA: Diagnosis not present

## 2019-02-24 LAB — NOVEL CORONAVIRUS, NAA: SARS-CoV-2, NAA: NOT DETECTED

## 2019-03-04 DIAGNOSIS — R972 Elevated prostate specific antigen [PSA]: Secondary | ICD-10-CM | POA: Diagnosis not present

## 2019-03-05 DIAGNOSIS — I10 Essential (primary) hypertension: Secondary | ICD-10-CM | POA: Diagnosis not present

## 2019-03-05 DIAGNOSIS — Z23 Encounter for immunization: Secondary | ICD-10-CM | POA: Diagnosis not present

## 2019-03-05 DIAGNOSIS — N529 Male erectile dysfunction, unspecified: Secondary | ICD-10-CM | POA: Diagnosis not present

## 2019-03-05 DIAGNOSIS — N400 Enlarged prostate without lower urinary tract symptoms: Secondary | ICD-10-CM | POA: Diagnosis not present

## 2019-03-05 DIAGNOSIS — R972 Elevated prostate specific antigen [PSA]: Secondary | ICD-10-CM | POA: Diagnosis not present

## 2019-03-05 DIAGNOSIS — E782 Mixed hyperlipidemia: Secondary | ICD-10-CM | POA: Diagnosis not present

## 2019-03-05 DIAGNOSIS — R609 Edema, unspecified: Secondary | ICD-10-CM | POA: Diagnosis not present

## 2019-03-05 DIAGNOSIS — Z6838 Body mass index (BMI) 38.0-38.9, adult: Secondary | ICD-10-CM | POA: Diagnosis not present

## 2019-03-05 DIAGNOSIS — G4733 Obstructive sleep apnea (adult) (pediatric): Secondary | ICD-10-CM | POA: Diagnosis not present

## 2019-03-07 MED FILL — LOSARTAN POTASSIUM 100 MG T: 100 | 30 days supply | Qty: 30 | Fill #5

## 2019-03-11 DIAGNOSIS — N138 Other obstructive and reflux uropathy: Secondary | ICD-10-CM | POA: Diagnosis not present

## 2019-03-11 DIAGNOSIS — N529 Male erectile dysfunction, unspecified: Secondary | ICD-10-CM | POA: Diagnosis not present

## 2019-03-11 DIAGNOSIS — E291 Testicular hypofunction: Secondary | ICD-10-CM | POA: Diagnosis not present

## 2019-03-11 DIAGNOSIS — N401 Enlarged prostate with lower urinary tract symptoms: Secondary | ICD-10-CM | POA: Diagnosis not present

## 2019-03-11 MED FILL — FINASTERIDE 5 MG TABLET: 5 | 90 days supply | Qty: 90 | Fill #0

## 2019-03-15 MED FILL — PRAVASTATIN NA 40 MG TAB: 40 | 90 days supply | Qty: 90 | Fill #1

## 2019-03-15 MED FILL — METOPROLOL SUCCINATE ER 200: 200 | 90 days supply | Qty: 90 | Fill #1

## 2019-03-17 DIAGNOSIS — I8312 Varicose veins of left lower extremity with inflammation: Secondary | ICD-10-CM | POA: Diagnosis not present

## 2019-03-19 DIAGNOSIS — I8311 Varicose veins of right lower extremity with inflammation: Secondary | ICD-10-CM | POA: Diagnosis not present

## 2019-03-30 DIAGNOSIS — I8312 Varicose veins of left lower extremity with inflammation: Secondary | ICD-10-CM | POA: Diagnosis not present

## 2019-04-06 MED FILL — FUROSEMIDE 20 MG TABS: 20 | 90 days supply | Qty: 90 | Fill #0

## 2019-04-06 MED FILL — AMLODIPINE BESYLATE 10 MG T: 10 | 90 days supply | Qty: 90 | Fill #0

## 2019-04-06 MED FILL — LOSARTAN POTASSIUM 100 MG T: 100 | 30 days supply | Qty: 30 | Fill #0

## 2019-04-27 DIAGNOSIS — M7981 Nontraumatic hematoma of soft tissue: Secondary | ICD-10-CM | POA: Diagnosis not present

## 2019-04-27 DIAGNOSIS — I8311 Varicose veins of right lower extremity with inflammation: Secondary | ICD-10-CM | POA: Diagnosis not present

## 2019-05-05 MED FILL — POTASSIUM CHLORIDE CRYS ER: 20 | 90 days supply | Qty: 90 | Fill #1

## 2019-05-11 DIAGNOSIS — I8312 Varicose veins of left lower extremity with inflammation: Secondary | ICD-10-CM | POA: Diagnosis not present

## 2019-05-17 MED FILL — LOSARTAN POTASSIUM 100 MG T: 100 | 30 days supply | Qty: 30 | Fill #1

## 2019-05-18 DIAGNOSIS — G4733 Obstructive sleep apnea (adult) (pediatric): Secondary | ICD-10-CM | POA: Diagnosis not present

## 2019-05-21 ENCOUNTER — Encounter (HOSPITAL_COMMUNITY): Payer: Self-pay

## 2019-05-21 ENCOUNTER — Ambulatory Visit (HOSPITAL_COMMUNITY)
Admission: EM | Admit: 2019-05-21 | Discharge: 2019-05-21 | Disposition: A | Discharge status: Home / Self Care | Payer: 59 | Attending: Emergency Medicine | Admitting: Emergency Medicine

## 2019-05-21 ENCOUNTER — Ambulatory Visit (INDEPENDENT_AMBULATORY_CARE_PROVIDER_SITE_OTHER): Payer: 59

## 2019-05-21 ENCOUNTER — Other Ambulatory Visit: Payer: Self-pay

## 2019-05-21 DIAGNOSIS — R2242 Localized swelling, mass and lump, left lower limb: Secondary | ICD-10-CM | POA: Diagnosis not present

## 2019-05-21 DIAGNOSIS — S9032XA Contusion of left foot, initial encounter: Secondary | ICD-10-CM | POA: Diagnosis not present

## 2019-05-21 DIAGNOSIS — M79672 Pain in left foot: Secondary | ICD-10-CM | POA: Diagnosis not present

## 2019-05-21 DIAGNOSIS — M7989 Other specified soft tissue disorders: Secondary | ICD-10-CM | POA: Diagnosis not present

## 2019-05-21 NOTE — ED Provider Notes (Signed)
[place]    CSN: [id number] Arrival date & time: 05/21/19  1613      History   Chief Complaint Chief Complaint  Patient presents with  . Foot Injury    HPI ARIK REMPE is a 59 y.o. male.   Princess Bruins presents with complaints of bruising and altered appearance of second toe of left foot. He noted it this morning. No specific injury, states he had been gathering wood for a fire last night. Potentially dropped a log on the toe, but doesn't recall any incident. He was concerned it was dislocated in appearance, therefore his family (who work in healthcare) attempted to pull on the toe to reduce it. Has had pain since then. Prior to this did not have pain, just noted to abnormal appearance and bruising. Took ibuprofen which did help. No numbness or tingling. Denies any previous similar.    ROS per HPI, negative if not otherwise mentioned.      Past Medical History:  Diagnosis Date  . Hypertension     There are no problems to display for this patient.   Past Surgical History:  Procedure Laterality Date  . HERNIA REPAIR         Home Medications    Prior to Admission medications   Medication Sig Start Date End Date Taking? Authorizing Provider  AMLODIPINE BESYLATE PO Take by mouth.    [provider]  Furosemide (LASIX PO) Take by mouth.    [provider]  LOSARTAN POTASSIUM PO Take by mouth.    [provider]    Family History History reviewed. No pertinent family history.  Social History Social History   Tobacco Use  . Smoking status: Never Smoker  . Smokeless tobacco: Never Used  Substance Use Topics  . Alcohol use: Yes  . Drug use: Not on file     Allergies   Crestor [rosuvastatin calcium]   Review of Systems Review of Systems   Physical Exam Triage Vital Signs ED Triage Vitals  Enc Vitals Group     BP 05/21/19 1649 (!) 179/90     Pulse Rate 05/21/19 1649 62     Resp 05/21/19 1649 16       Temp 05/21/19 1649 99.1 F (37.3 C)     Temp Source 05/21/19 1649 Oral     SpO2 05/21/19 1649 99 %     Weight --      Height --      Head Circumference --      Peak Flow --      Pain Score 05/21/19 1648 6     Pain Loc --      Pain Edu? --      Excl. in [place]? --    No data found.  Updated Vital Signs BP (!) 179/90 (BP Location: Right Arm)   Pulse 62   Temp 99.1 F (37.3 C) (Oral)   Resp 16   SpO2 99%    Physical Exam Constitutional:      Appearance: He is well-developed.  Cardiovascular:     Rate and Rhythm: Normal rate.  Pulmonary:     Effort: Pulmonary effort is normal.  Musculoskeletal:     Left foot: Normal range of motion. Tenderness and bony tenderness present.     Comments: Bruising and mild tenderness to left foot 2nd toe at MTP joint and proximal phalanx; full ROM of toe, no pain with ROM of toe; cap refill < 2 seconds   Skin:  General: Skin is warm and dry.  Neurological:     Mental Status: He is alert and oriented to person, place, and time.      UC Treatments / Results  Labs (all labs ordered are listed, but only abnormal results are displayed) Labs Reviewed - No data to display  EKG   Radiology DG Foot Complete Left  Result Date: 05/21/2019 CLINICAL DATA:  Dropped log on foot yesterday with pain and swelling, initial encounter EXAM: LEFT FOOT - COMPLETE 3+ VIEW COMPARISON:  None. FINDINGS: There is no evidence of fracture or dislocation. There is no evidence of arthropathy or other focal bone abnormality. Soft tissues are unremarkable. IMPRESSION: No acute abnormality noted. Electronically Signed   By: Inez Catalina M.D.   On: 05/21/2019 18:34    Procedures Procedures (including critical care time)  Medications Ordered in UC Medications - No data to display  Initial Impression / Assessment and Plan / UC Course  I have reviewed the triage vital signs and the nursing notes.  Pertinent labs & imaging results that were available during my  care of the patient were reviewed by me and considered in my medical decision making (see chart for details).     No acute fractures or dislocation on xray. Reduced dislocation by family members vs contusion discussed and considered. Post op shoe provided. Pain management discussed. Podiatry follow up prn. Patient verbalized understanding and agreeable to plan.  Ambulatory out of clinic without difficulty.    Final Clinical Impressions(s) / UC Diagnoses   Final diagnoses:  Contusion of left foot, initial encounter     Discharge Instructions     Your xray is normal today. So either your toe was not dislocated or it was successfully reduced.  Ice, elevation, use of ibuprofen as needed for pain.  Use of the shoe as provided to help with support/ immobilization of the affected joint.  If persistent symptoms or issues please follow up with podiatry.     ED Prescriptions    None     PDMP not reviewed this encounter.   Zigmund Gottron, NP 05/21/19 [phone]

## 2019-05-21 NOTE — ED Triage Notes (Signed)
Pt C/O left foot injury, Pt dropped a log on his foot and did not realize it was swollen until this morning. There is some discoloration to the foot as well.

## 2019-05-21 NOTE — Discharge Instructions (Signed)
Your xray is normal today. So either your toe was not dislocated or it was successfully reduced.  Ice, elevation, use of ibuprofen as needed for pain.  Use of the shoe as provided to help with support/ immobilization of the affected joint.  If persistent symptoms or issues please follow up with podiatry.

## 2019-06-01 DIAGNOSIS — I8312 Varicose veins of left lower extremity with inflammation: Secondary | ICD-10-CM | POA: Diagnosis not present

## 2019-06-03 MED FILL — FINASTERIDE 5 MG TABLET: 5 | 90 days supply | Qty: 90 | Fill #1

## 2019-06-11 MED FILL — LOSARTAN POTASSIUM 100 MG T: 100 | 30 days supply | Qty: 30 | Fill #0

## 2019-06-14 MED FILL — METOPROLOL SUCCINATE ER 200: 200 | 90 days supply | Qty: 90 | Fill #0

## 2019-06-14 MED FILL — PRAVASTATIN NA 40 MG TAB: 40 | 90 days supply | Qty: 90 | Fill #0

## 2019-06-18 DIAGNOSIS — E782 Mixed hyperlipidemia: Secondary | ICD-10-CM | POA: Diagnosis not present

## 2019-06-18 DIAGNOSIS — I10 Essential (primary) hypertension: Secondary | ICD-10-CM | POA: Diagnosis not present

## 2019-06-21 DIAGNOSIS — R739 Hyperglycemia, unspecified: Secondary | ICD-10-CM | POA: Diagnosis not present

## 2019-06-21 DIAGNOSIS — E782 Mixed hyperlipidemia: Secondary | ICD-10-CM | POA: Diagnosis not present

## 2019-06-21 DIAGNOSIS — Z0000 Encounter for general adult medical examination without abnormal findings: Secondary | ICD-10-CM | POA: Diagnosis not present

## 2019-06-21 DIAGNOSIS — I10 Essential (primary) hypertension: Secondary | ICD-10-CM | POA: Diagnosis not present

## 2019-06-21 DIAGNOSIS — R972 Elevated prostate specific antigen [PSA]: Secondary | ICD-10-CM | POA: Diagnosis not present

## 2019-06-21 DIAGNOSIS — Z6838 Body mass index (BMI) 38.0-38.9, adult: Secondary | ICD-10-CM | POA: Diagnosis not present

## 2019-06-21 DIAGNOSIS — N529 Male erectile dysfunction, unspecified: Secondary | ICD-10-CM | POA: Diagnosis not present

## 2019-06-21 DIAGNOSIS — E876 Hypokalemia: Secondary | ICD-10-CM | POA: Diagnosis not present

## 2019-06-21 DIAGNOSIS — N400 Enlarged prostate without lower urinary tract symptoms: Secondary | ICD-10-CM | POA: Diagnosis not present

## 2019-06-21 MED FILL — FAMCICLOVIR 500 MG TABLET: 500 | 1 days supply | Qty: 3 | Fill #0

## 2019-06-22 DIAGNOSIS — M7981 Nontraumatic hematoma of soft tissue: Secondary | ICD-10-CM | POA: Diagnosis not present

## 2019-06-22 DIAGNOSIS — I8312 Varicose veins of left lower extremity with inflammation: Secondary | ICD-10-CM | POA: Diagnosis not present

## 2019-06-29 MED FILL — FUROSEMIDE 20 MG TABS: 20 | 90 days supply | Qty: 90 | Fill #1

## 2019-07-05 MED FILL — LOSARTAN POTASSIUM 100 MG T: 100 | 30 days supply | Qty: 30 | Fill #1

## 2019-07-19 MED FILL — AMLODIPINE BESYLATE 10 MG T: 10 | 90 days supply | Qty: 90 | Fill #1

## 2019-07-21 DIAGNOSIS — I8312 Varicose veins of left lower extremity with inflammation: Secondary | ICD-10-CM | POA: Diagnosis not present

## 2019-07-21 DIAGNOSIS — M7981 Nontraumatic hematoma of soft tissue: Secondary | ICD-10-CM | POA: Diagnosis not present

## 2019-08-03 MED FILL — POTASSIUM CHLORIDE CRYS ER: 20 | 90 days supply | Qty: 90 | Fill #0

## 2019-08-04 MED FILL — LOSARTAN POTASSIUM 100 MG T: 100 | 90 days supply | Qty: 90 | Fill #0

## 2019-08-18 DIAGNOSIS — G4733 Obstructive sleep apnea (adult) (pediatric): Secondary | ICD-10-CM | POA: Diagnosis not present

## 2019-10-27 DIAGNOSIS — I10 Essential (primary) hypertension: Secondary | ICD-10-CM | POA: Diagnosis not present

## 2019-10-27 DIAGNOSIS — E782 Mixed hyperlipidemia: Secondary | ICD-10-CM | POA: Diagnosis not present

## 2019-10-27 DIAGNOSIS — R739 Hyperglycemia, unspecified: Secondary | ICD-10-CM | POA: Diagnosis not present

## 2019-11-03 DIAGNOSIS — I10 Essential (primary) hypertension: Secondary | ICD-10-CM | POA: Diagnosis not present

## 2019-11-03 DIAGNOSIS — E782 Mixed hyperlipidemia: Secondary | ICD-10-CM | POA: Diagnosis not present

## 2019-11-03 DIAGNOSIS — E876 Hypokalemia: Secondary | ICD-10-CM | POA: Diagnosis not present

## 2019-11-03 DIAGNOSIS — E291 Testicular hypofunction: Secondary | ICD-10-CM | POA: Diagnosis not present

## 2019-11-03 DIAGNOSIS — R972 Elevated prostate specific antigen [PSA]: Secondary | ICD-10-CM | POA: Diagnosis not present

## 2019-11-03 DIAGNOSIS — N529 Male erectile dysfunction, unspecified: Secondary | ICD-10-CM | POA: Diagnosis not present

## 2019-11-03 DIAGNOSIS — N400 Enlarged prostate without lower urinary tract symptoms: Secondary | ICD-10-CM | POA: Diagnosis not present

## 2019-11-03 DIAGNOSIS — B009 Herpesviral infection, unspecified: Secondary | ICD-10-CM | POA: Diagnosis not present

## 2019-12-20 MED FILL — FUROSEMIDE 20 MG TABS: 20 | 90 days supply | Qty: 90 | Fill #1

## 2020-02-17 DIAGNOSIS — G4733 Obstructive sleep apnea (adult) (pediatric): Secondary | ICD-10-CM | POA: Diagnosis not present

## 2020-02-18 DIAGNOSIS — E782 Mixed hyperlipidemia: Secondary | ICD-10-CM | POA: Diagnosis not present

## 2020-02-18 DIAGNOSIS — I10 Essential (primary) hypertension: Secondary | ICD-10-CM | POA: Diagnosis not present

## 2020-02-23 DIAGNOSIS — H524 Presbyopia: Secondary | ICD-10-CM | POA: Diagnosis not present

## 2020-02-25 ENCOUNTER — Other Ambulatory Visit (HOSPITAL_COMMUNITY): Payer: Self-pay | Admitting: Internal Medicine

## 2020-02-25 DIAGNOSIS — E291 Testicular hypofunction: Secondary | ICD-10-CM | POA: Diagnosis not present

## 2020-02-25 DIAGNOSIS — R739 Hyperglycemia, unspecified: Secondary | ICD-10-CM | POA: Diagnosis not present

## 2020-02-25 DIAGNOSIS — R972 Elevated prostate specific antigen [PSA]: Secondary | ICD-10-CM | POA: Diagnosis not present

## 2020-02-25 DIAGNOSIS — N529 Male erectile dysfunction, unspecified: Secondary | ICD-10-CM | POA: Diagnosis not present

## 2020-02-25 DIAGNOSIS — B009 Herpesviral infection, unspecified: Secondary | ICD-10-CM | POA: Diagnosis not present

## 2020-02-25 DIAGNOSIS — E782 Mixed hyperlipidemia: Secondary | ICD-10-CM | POA: Diagnosis not present

## 2020-02-25 DIAGNOSIS — E876 Hypokalemia: Secondary | ICD-10-CM | POA: Diagnosis not present

## 2020-02-25 DIAGNOSIS — N400 Enlarged prostate without lower urinary tract symptoms: Secondary | ICD-10-CM | POA: Diagnosis not present

## 2020-02-25 DIAGNOSIS — I10 Essential (primary) hypertension: Secondary | ICD-10-CM | POA: Diagnosis not present

## 2020-02-25 MED FILL — PRAVASTATIN NA 40 MG TAB: 40 | 90 days supply | Qty: 90 | Fill #0

## 2020-02-25 MED FILL — LOSARTAN POTASSIUM 100 MG T: 100 | 90 days supply | Qty: 90 | Fill #0

## 2020-02-25 MED FILL — POTASSIUM CHLORIDE CRYS ER: 20 | 90 days supply | Qty: 90 | Fill #0

## 2020-02-25 MED FILL — METOPROLOL SUCCINATE ER 200: 200 | 90 days supply | Qty: 90 | Fill #0

## 2020-03-02 DIAGNOSIS — H6983 Other specified disorders of Eustachian tube, bilateral: Secondary | ICD-10-CM | POA: Diagnosis not present

## 2020-03-14 DIAGNOSIS — G4733 Obstructive sleep apnea (adult) (pediatric): Secondary | ICD-10-CM | POA: Diagnosis not present

## 2020-03-14 DIAGNOSIS — H6983 Other specified disorders of Eustachian tube, bilateral: Secondary | ICD-10-CM | POA: Diagnosis not present

## 2020-03-14 DIAGNOSIS — Z9989 Dependence on other enabling machines and devices: Secondary | ICD-10-CM | POA: Diagnosis not present

## 2020-03-14 DIAGNOSIS — Z9109 Other allergy status, other than to drugs and biological substances: Secondary | ICD-10-CM | POA: Diagnosis not present

## 2020-03-20 ENCOUNTER — Other Ambulatory Visit (HOSPITAL_COMMUNITY): Payer: Self-pay | Admitting: Internal Medicine

## 2020-03-20 MED FILL — FUROSEMIDE 20 MG TABS: 20 | 90 days supply | Qty: 90 | Fill #0

## 2020-04-17 DIAGNOSIS — E291 Testicular hypofunction: Secondary | ICD-10-CM | POA: Diagnosis not present

## 2020-04-24 DIAGNOSIS — N529 Male erectile dysfunction, unspecified: Secondary | ICD-10-CM | POA: Diagnosis not present

## 2020-04-24 DIAGNOSIS — N138 Other obstructive and reflux uropathy: Secondary | ICD-10-CM | POA: Diagnosis not present

## 2020-04-24 DIAGNOSIS — E291 Testicular hypofunction: Secondary | ICD-10-CM | POA: Diagnosis not present

## 2020-04-24 DIAGNOSIS — N401 Enlarged prostate with lower urinary tract symptoms: Secondary | ICD-10-CM | POA: Diagnosis not present

## 2020-05-26 IMAGING — DX DG FOOT COMPLETE 3+V*L*
3 series · 3 of 3 positions shown · non-contrast
Comparison: None.

CLINICAL DATA: Dropped log on foot yesterday with pain and
swelling, initial encounter

EXAM:
LEFT FOOT - COMPLETE 3+ VIEW

[foot ap]
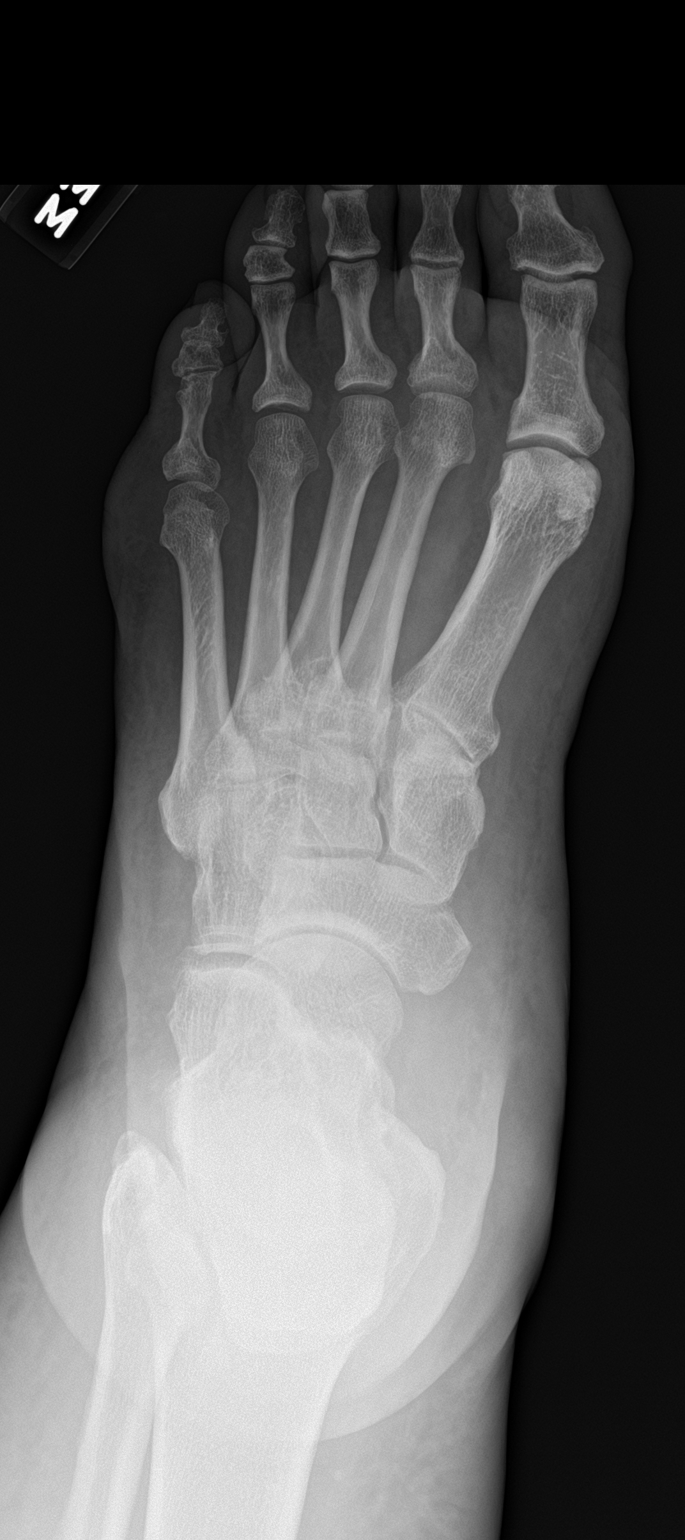

[foot obl]
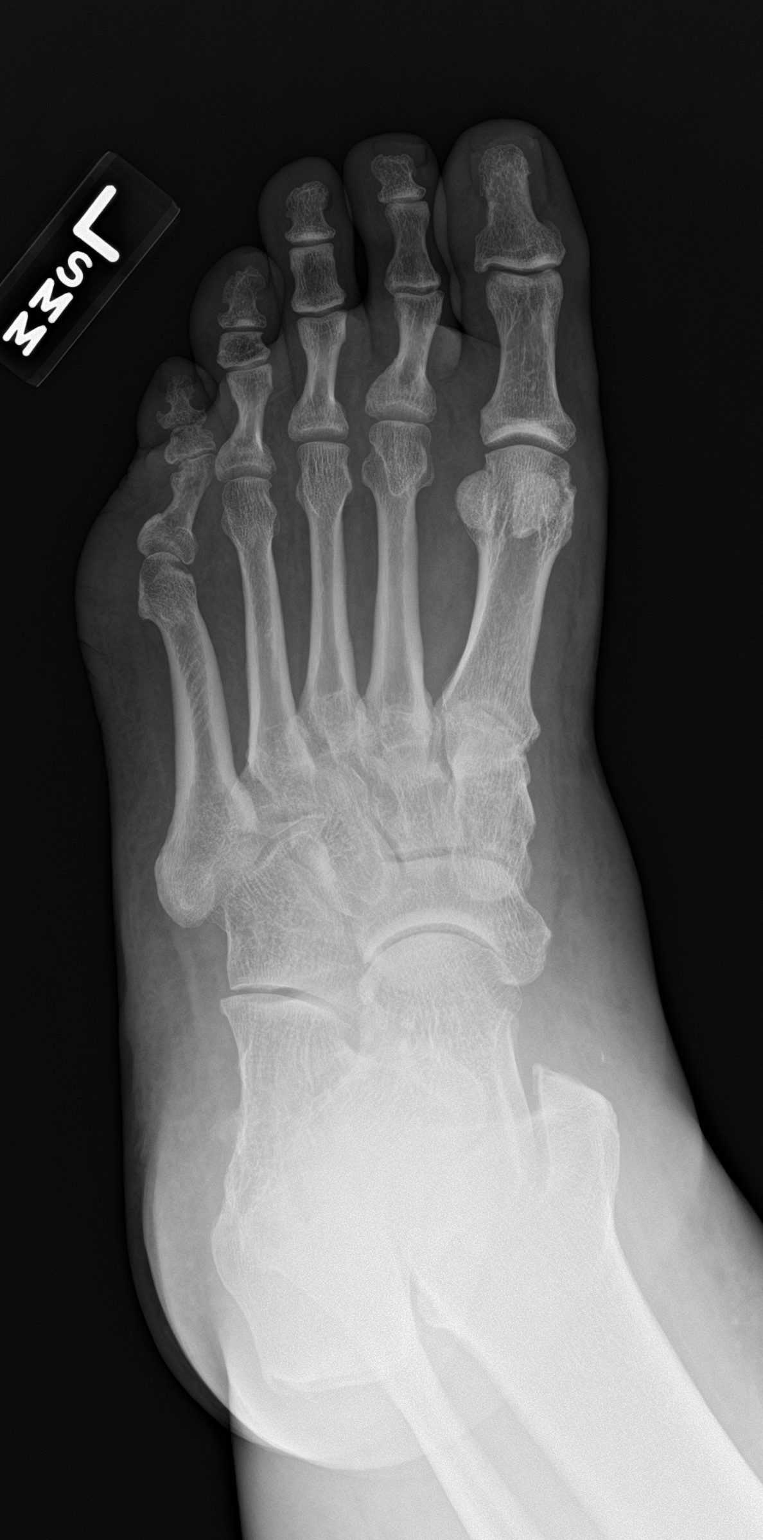

[foot lat]
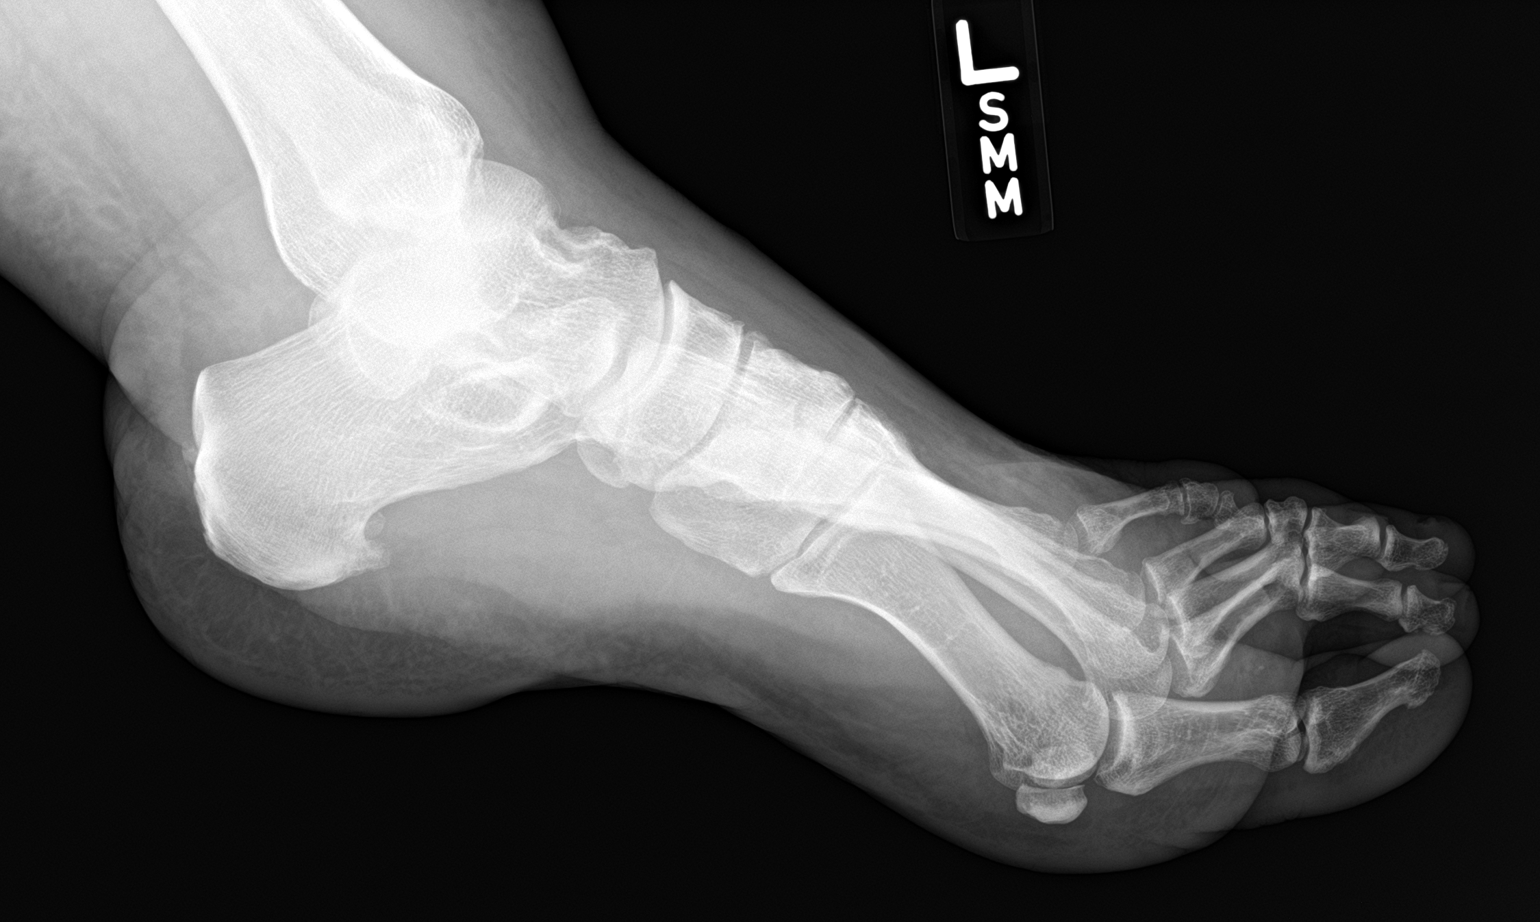

[3 of 3 positions shown; findings below may reference images not displayed]

FINDINGS: There is no evidence of fracture or dislocation. There is no
evidence of arthropathy or other focal bone abnormality. Soft
tissues are unremarkable.
IMPRESSION: No acute abnormality noted.

## 2020-06-12 DIAGNOSIS — I10 Essential (primary) hypertension: Secondary | ICD-10-CM | POA: Diagnosis not present

## 2020-06-12 DIAGNOSIS — R972 Elevated prostate specific antigen [PSA]: Secondary | ICD-10-CM | POA: Diagnosis not present

## 2020-06-12 DIAGNOSIS — E782 Mixed hyperlipidemia: Secondary | ICD-10-CM | POA: Diagnosis not present

## 2020-06-12 MED FILL — FUROSEMIDE 20 MG TABS: 20 | 90 days supply | Qty: 90 | Fill #1

## 2020-06-29 MED FILL — PRAVASTATIN NA 40 MG TAB: 40 | 90 days supply | Qty: 90 | Fill #1

## 2020-08-08 ENCOUNTER — Other Ambulatory Visit (HOSPITAL_BASED_OUTPATIENT_CLINIC_OR_DEPARTMENT_OTHER): Payer: Self-pay

## 2020-09-05 ENCOUNTER — Other Ambulatory Visit (HOSPITAL_COMMUNITY): Payer: Self-pay

## 2020-09-05 MED FILL — Potassium Chloride Microencapsulated Crys ER Tab 20 mEq: ORAL | 30 days supply | Qty: 30 | Fill #0 | Status: AC

## 2020-09-18 ENCOUNTER — Other Ambulatory Visit (HOSPITAL_COMMUNITY): Payer: Self-pay

## 2020-09-19 ENCOUNTER — Other Ambulatory Visit (HOSPITAL_COMMUNITY): Payer: Self-pay

## 2020-09-20 ENCOUNTER — Other Ambulatory Visit (HOSPITAL_COMMUNITY): Payer: Self-pay

## 2020-09-20 MED ORDER — PRAVASTATIN SODIUM 40 MG PO TABS
40.0000 mg | ORAL_TABLET | Freq: Every day | ORAL | 1 refills | Status: DC
  Filled 2020-09-20: qty 30, 30d supply, fill #0
  Filled 2020-10-24: qty 30, 30d supply, fill #1
  Filled 2020-11-22: qty 30, 30d supply, fill #2
  Filled 2020-12-25: qty 30, 30d supply, fill #3
  Filled 2021-01-18: qty 30, 30d supply, fill #4
  Filled 2021-02-19: qty 30, 30d supply, fill #5

## 2020-09-21 ENCOUNTER — Other Ambulatory Visit (HOSPITAL_COMMUNITY): Payer: Self-pay

## 2020-10-04 ENCOUNTER — Other Ambulatory Visit (HOSPITAL_COMMUNITY): Payer: Self-pay

## 2020-10-04 MED FILL — Potassium Chloride Microencapsulated Crys ER Tab 20 mEq: ORAL | 30 days supply | Qty: 30 | Fill #1 | Status: AC

## 2020-10-24 ENCOUNTER — Other Ambulatory Visit (HOSPITAL_COMMUNITY): Payer: Self-pay

## 2020-10-26 ENCOUNTER — Other Ambulatory Visit (HOSPITAL_COMMUNITY): Payer: Self-pay

## 2020-10-26 MED ORDER — FAMCICLOVIR 500 MG PO TABS
1500.0000 mg | ORAL_TABLET | ORAL | 0 refills | Status: DC
  Filled 2020-10-26: qty 3, 1d supply, fill #0

## 2020-10-30 ENCOUNTER — Other Ambulatory Visit (HOSPITAL_COMMUNITY): Payer: Self-pay

## 2020-11-01 ENCOUNTER — Other Ambulatory Visit (HOSPITAL_COMMUNITY): Payer: Self-pay

## 2020-11-01 MED ORDER — POTASSIUM CHLORIDE CRYS ER 20 MEQ PO TBCR
20.0000 meq | EXTENDED_RELEASE_TABLET | Freq: Every day | ORAL | 1 refills | Status: DC
  Filled 2020-11-01: qty 30, 30d supply, fill #0
  Filled 2020-11-30: qty 30, 30d supply, fill #1
  Filled 2020-12-26: qty 30, 30d supply, fill #2
  Filled 2021-01-31: qty 30, 30d supply, fill #3
  Filled 2021-02-26: qty 30, 30d supply, fill #4
  Filled 2021-03-31: qty 30, 30d supply, fill #5

## 2020-11-10 ENCOUNTER — Other Ambulatory Visit (HOSPITAL_COMMUNITY): Payer: Self-pay

## 2020-11-10 MED FILL — Losartan Potassium Tab 100 MG: ORAL | 30 days supply | Qty: 30 | Fill #0 | Status: AC

## 2020-11-22 ENCOUNTER — Other Ambulatory Visit (HOSPITAL_COMMUNITY): Payer: Self-pay

## 2020-11-30 ENCOUNTER — Other Ambulatory Visit (HOSPITAL_COMMUNITY): Payer: Self-pay

## 2020-12-11 ENCOUNTER — Other Ambulatory Visit (HOSPITAL_COMMUNITY): Payer: Self-pay

## 2020-12-11 MED FILL — Losartan Potassium Tab 100 MG: ORAL | 30 days supply | Qty: 30 | Fill #1 | Status: AC

## 2020-12-25 ENCOUNTER — Other Ambulatory Visit (HOSPITAL_COMMUNITY): Payer: Self-pay

## 2020-12-26 ENCOUNTER — Other Ambulatory Visit (HOSPITAL_COMMUNITY): Payer: Self-pay

## 2021-01-09 ENCOUNTER — Other Ambulatory Visit (HOSPITAL_COMMUNITY): Payer: Self-pay

## 2021-01-09 MED FILL — Losartan Potassium Tab 100 MG: ORAL | 30 days supply | Qty: 30 | Fill #2 | Status: AC

## 2021-01-18 ENCOUNTER — Other Ambulatory Visit (HOSPITAL_COMMUNITY): Payer: Self-pay

## 2021-01-31 ENCOUNTER — Other Ambulatory Visit (HOSPITAL_COMMUNITY): Payer: Self-pay

## 2021-02-08 ENCOUNTER — Other Ambulatory Visit (HOSPITAL_COMMUNITY): Payer: Self-pay

## 2021-02-08 MED ORDER — FINASTERIDE 5 MG PO TABS
5.0000 mg | ORAL_TABLET | Freq: Every day | ORAL | 3 refills | Status: DC
  Filled 2021-02-08: qty 30, 30d supply, fill #0

## 2021-02-13 ENCOUNTER — Other Ambulatory Visit (HOSPITAL_COMMUNITY): Payer: Self-pay

## 2021-02-13 MED ORDER — LOSARTAN POTASSIUM 100 MG PO TABS
100.0000 mg | ORAL_TABLET | Freq: Every day | ORAL | 1 refills | Status: DC
  Filled 2021-02-13: qty 30, 30d supply, fill #0
  Filled 2021-03-12: qty 30, 30d supply, fill #1
  Filled 2021-04-04: qty 30, 30d supply, fill #2
  Filled 2021-05-07: qty 30, 30d supply, fill #3
  Filled 2021-06-05: qty 30, 30d supply, fill #4
  Filled 2021-07-06: qty 30, 30d supply, fill #5

## 2021-02-19 ENCOUNTER — Other Ambulatory Visit (HOSPITAL_COMMUNITY): Payer: Self-pay

## 2021-02-27 ENCOUNTER — Other Ambulatory Visit (HOSPITAL_COMMUNITY): Payer: Self-pay

## 2021-03-12 ENCOUNTER — Other Ambulatory Visit (HOSPITAL_COMMUNITY): Payer: Self-pay

## 2021-03-24 ENCOUNTER — Other Ambulatory Visit (HOSPITAL_COMMUNITY): Payer: Self-pay

## 2021-03-26 ENCOUNTER — Other Ambulatory Visit (HOSPITAL_COMMUNITY): Payer: Self-pay

## 2021-03-26 MED ORDER — PRAVASTATIN SODIUM 40 MG PO TABS
40.0000 mg | ORAL_TABLET | Freq: Every day | ORAL | 1 refills | Status: DC
  Filled 2021-03-26: qty 30, 30d supply, fill #0
  Filled 2021-04-22: qty 30, 30d supply, fill #1
  Filled 2021-05-24: qty 30, 30d supply, fill #2
  Filled 2021-06-23: qty 30, 30d supply, fill #3
  Filled 2021-07-23: qty 30, 30d supply, fill #4
  Filled 2021-08-20: qty 30, 30d supply, fill #5

## 2021-03-31 ENCOUNTER — Other Ambulatory Visit (HOSPITAL_COMMUNITY): Payer: Self-pay

## 2021-04-04 ENCOUNTER — Other Ambulatory Visit (HOSPITAL_COMMUNITY): Payer: Self-pay

## 2021-04-06 ENCOUNTER — Other Ambulatory Visit (HOSPITAL_COMMUNITY): Payer: Self-pay

## 2021-04-23 ENCOUNTER — Other Ambulatory Visit (HOSPITAL_COMMUNITY): Payer: Self-pay

## 2021-05-01 ENCOUNTER — Other Ambulatory Visit (HOSPITAL_COMMUNITY): Payer: Self-pay

## 2021-05-02 ENCOUNTER — Other Ambulatory Visit (HOSPITAL_COMMUNITY): Payer: Self-pay

## 2021-05-02 MED ORDER — POTASSIUM CHLORIDE CRYS ER 20 MEQ PO TBCR
20.0000 meq | EXTENDED_RELEASE_TABLET | Freq: Every day | ORAL | 1 refills | Status: DC
  Filled 2021-05-02: qty 34, 34d supply, fill #0
  Filled 2021-06-05: qty 34, 34d supply, fill #1
  Filled 2021-07-06: qty 30, 30d supply, fill #2
  Filled 2021-08-06: qty 30, 30d supply, fill #3
  Filled 2021-09-03: qty 30, 30d supply, fill #4
  Filled 2021-10-07: qty 30, 30d supply, fill #5

## 2021-05-07 ENCOUNTER — Other Ambulatory Visit (HOSPITAL_COMMUNITY): Payer: Self-pay

## 2021-05-24 ENCOUNTER — Other Ambulatory Visit (HOSPITAL_COMMUNITY): Payer: Self-pay

## 2021-05-30 ENCOUNTER — Other Ambulatory Visit (HOSPITAL_COMMUNITY): Payer: Self-pay

## 2021-05-30 MED ORDER — HYDRALAZINE HCL 50 MG PO TABS
ORAL_TABLET | ORAL | 1 refills | Status: DC
  Filled 2021-05-30: qty 90, 30d supply, fill #0

## 2021-05-31 NOTE — Progress Notes (Signed)
Sent message, via epic in basket, requesting orders in epic from surgeon.  

## 2021-06-05 ENCOUNTER — Other Ambulatory Visit (HOSPITAL_COMMUNITY): Payer: Self-pay

## 2021-06-12 NOTE — Progress Notes (Addendum)
Anesthesia Review:  PCP: Alvester Chou- pt saw for clearance for surgery.  Pt stated ekg abnormal sent to cardiology  LOV 05/30/21  Cardiologist : DR Abran Richard- ekg done  Cleared for surgery  OV- 05/31/21  Chest x-ray : EKG : 03/30/22 and 05/31/21 from [facility] on chart  Echo : Stress test: Cardiac Cath :  Activity level: can do a flight of stairs without difficulty  Sleep Study/ CPAP : has cpap  Fasting Blood Sugar :      / Checks Blood Sugar -- times a day:   Blood Thinner/ Instructions /Last Dose: ASA / Instructions/ Last Dose :   No covid test- ambulatory  Have requested any ekgs done at PCP and Cardiology and any cardiology tests performed by fax from [facility]  No covid- ambulatory surgery  CBC/Diff, CMp, hgba1c done 05/30/21. - results in epic  Hgba1c-05/30/21- 5.3  Blood pressure elevated at preop appt. PT denies any chest pain, shortness of breath, dizziness or headache.  PT states that MDs working on blood pressure.  PT admits to being nervous regarding surgery.

## 2021-06-12 NOTE — Progress Notes (Signed)
Your procedure is scheduled on:      06/21/21   Report to [facility] Main  Entrance   Report to admitting at  [place] AM     Call this number if you have problems the morning of surgery [phone]    REMEMBER: NO  SOLID FOOD CANDY OR GUM AFTER MIDNIGHT. CLEAR LIQUIDS UNTIL   0830am       . NOTHING BY MOUTH EXCEPT CLEAR LIQUIDS UNTIL 0830am    . PLEASE FINISH ENSURE DRINK PER SURGEON ORDER  WHICH NEEDS TO BE COMPLETED AT     0830am  .      CLEAR LIQUID DIET   Foods Allowed                                                                    Coffee and tea, regular and decaf                            Fruit ices (not with fruit pulp)                                      Iced Popsicles                                    Carbonated beverages, regular and diet                                    Cranberry, grape and apple juices Sports drinks like Gatorade Lightly seasoned clear broth or consume(fat free) Sugar, honey syrup ___________________________________________________________________      BRUSH YOUR TEETH MORNING OF SURGERY AND RINSE YOUR MOUTH OUT, NO CHEWING GUM CANDY OR MINTS.     Take these medicines the morning of surgery with A SIP OF WATER: amlodipine, proscar, metoprolol   DO NOT TAKE ANY DIABETIC MEDICATIONS DAY OF YOUR SURGERY                               You may not have any metal on your body including hair pins and              piercings  Do not wear jewelry, make-up, lotions, powders or perfumes, deodorant             Do not wear nail polish on your fingernails.  Do not shave  48 hours prior to surgery.              Men may shave face and neck.   Do not bring valuables to the hospital. [place].  Contacts, dentures or bridgework may not be worn into surgery.  Leave suitcase in the car. After surgery it may be brought to your room.     Patients discharged the day of surgery will  not  be allowed to drive home. IF YOU ARE HAVING SURGERY AND GOING HOME THE SAME DAY, YOU MUST HAVE AN ADULT TO DRIVE YOU HOME AND BE WITH YOU FOR 24 HOURS. YOU MAY GO HOME BY TAXI OR UBER OR ORTHERWISE, BUT AN ADULT MUST ACCOMPANY YOU HOME AND STAY WITH YOU FOR 24 HOURS.  Name and phone number of your driver:  Special Instructions: N/A              Please read over the following fact sheets you were given: _____________________________________________________________________  [facility] - Preparing for Surgery Before surgery, you can play an important role.  Because skin is not sterile, your skin needs to be as free of germs as possible.  You can reduce the number of germs on your skin by washing with CHG (chlorahexidine gluconate) soap before surgery.  CHG is an antiseptic cleaner which kills germs and bonds with the skin to continue killing germs even after washing. Please DO NOT use if you have an allergy to CHG or antibacterial soaps.  If your skin becomes reddened/irritated stop using the CHG and inform your nurse when you arrive at Short Stay. Do not shave (including legs and underarms) for at least 48 hours prior to the first CHG shower.  You may shave your face/neck. Please follow these instructions carefully:  1.  Shower with CHG Soap the night before surgery and the  morning of Surgery.  2.  If you choose to wash your hair, wash your hair first as usual with your  normal  shampoo.  3.  After you shampoo, rinse your hair and body thoroughly to remove the  shampoo.                           4.  Use CHG as you would any other liquid soap.  You can apply chg directly  to the skin and wash                       Gently with a scrungie or clean washcloth.  5.  Apply the CHG Soap to your body ONLY FROM THE NECK DOWN.   Do not use on face/ open                           Wound or open sores. Avoid contact with eyes, ears mouth and genitals (private parts).                       Wash face,   Genitals (private parts) with your normal soap.             6.  Wash thoroughly, paying special attention to the area where your surgery  will be performed.  7.  Thoroughly rinse your body with warm water from the neck down.  8.  DO NOT shower/wash with your normal soap after using and rinsing off  the CHG Soap.                9.  Pat yourself dry with a clean towel.            10.  Wear clean pajamas.            11.  Place clean sheets on your bed the night of your first shower and do not  sleep with pets. Day of Surgery : Do not apply any  lotions/deodorants the morning of surgery.  Please wear clean clothes to the hospital/surgery center.  FAILURE TO FOLLOW THESE INSTRUCTIONS MAY RESULT IN THE CANCELLATION OF YOUR SURGERY PATIENT SIGNATURE_________________________________  NURSE SIGNATURE__________________________________  ________________________________________________________________________

## 2021-06-14 ENCOUNTER — Encounter (HOSPITAL_COMMUNITY)
Admission: RE | Admit: 2021-06-14 | Discharge: 2021-06-14 | Disposition: A | Discharge status: Home / Self Care | Payer: BC Managed Care – PPO | Source: Ambulatory Visit | Attending: Orthopedic Surgery | Admitting: Orthopedic Surgery

## 2021-06-14 ENCOUNTER — Encounter (HOSPITAL_COMMUNITY): Payer: Self-pay

## 2021-06-14 ENCOUNTER — Other Ambulatory Visit: Payer: Self-pay

## 2021-06-14 DIAGNOSIS — Z01812 Encounter for preprocedural laboratory examination: Secondary | ICD-10-CM | POA: Diagnosis present

## 2021-06-14 DIAGNOSIS — I10 Essential (primary) hypertension: Secondary | ICD-10-CM | POA: Insufficient documentation

## 2021-06-14 DIAGNOSIS — G4730 Sleep apnea, unspecified: Secondary | ICD-10-CM | POA: Diagnosis not present

## 2021-06-14 DIAGNOSIS — Y939 Activity, unspecified: Secondary | ICD-10-CM | POA: Diagnosis not present

## 2021-06-14 DIAGNOSIS — S8001XA Contusion of right knee, initial encounter: Secondary | ICD-10-CM | POA: Insufficient documentation

## 2021-06-14 HISTORY — DX: Sleep apnea, unspecified: G47.30

## 2021-06-15 NOTE — Progress Notes (Signed)
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 never smoker with h/o HTN, sleep apnea, right knee chronic hematoma scheduled for above procedure 06/21/21 with Dr. Paralee Cancel.   Pt seen by cardiology 05/31/2021. Per OV note, "At this point, he should be low risk for the proposed surgical procedure from a cardiac standpoint. I think this could be scheduled to soon as possible. I do not think we need to wait on the results of the echocardiogram prior to that procedure with his low risk nature."  Anticipate pt can proceed with planned procedure barring acute status change.   VS: BP (!) 168/97    Pulse (!) 54    Temp 36.8 C (Oral)    Resp 16    Ht 6\' 3"  (1.905 m)    Wt (!) 140 kg    SpO2 100%    BMI 38.58 kg/m   PROVIDERS: Beckie Salts, MD is PCP    LABS: Labs reviewed: Acceptable for surgery. and labs in [place] (all labs ordered are listed, but only abnormal results are displayed)  Labs Reviewed  TYPE AND SCREEN     IMAGES:   EKG: On chart  CV:  Past Medical History:  Diagnosis Date   Hypertension    Sleep apnea    CPAP    Past Surgical History:  Procedure Laterality Date   HERNIA REPAIR      MEDICATIONS:  amLODipine (NORVASC) 10 MG tablet   cholecalciferol (VITAMIN D3) 25 MCG (1000 UNIT) tablet   Cinnamon 500 MG TABS   Coenzyme Q10 (COQ10 PO)   finasteride (PROSCAR) 5 MG tablet   furosemide (LASIX) 40 MG tablet   hydrALAZINE (APRESOLINE) 50 MG tablet   losartan (COZAAR) 100 MG tablet   metoprolol (TOPROL-XL) 200 MG 24 hr tablet   Multiple Vitamins-Minerals (MULTIVITAMIN WITH MINERALS) tablet   NONFORMULARY OR COMPOUNDED ITEM   Omega-3 Fatty Acids (FISH OIL)  1000 MG CAPS   potassium chloride SA (KLOR-CON M) 20 MEQ tablet   pravastatin (PRAVACHOL) 40 MG tablet   No current facility-administered medications for this encounter.   Konrad Felix Ward, PA-C [facility] Pre-Surgical Testing [phone]

## 2021-06-20 NOTE — Anesthesia Preprocedure Evaluation (Addendum)
Anesthesia Evaluation  Patient identified by MRN, date of birth, ID band Patient awake    Reviewed: Allergy & Precautions, NPO status , Patient's Chart, lab work & pertinent test results  Airway Mallampati: II  TM Distance: >3 FB Neck ROM: Full    Dental no notable dental hx. (+) Teeth Intact, Dental Advisory Given   Pulmonary sleep apnea and Continuous Positive Airway Pressure Ventilation ,    Pulmonary exam normal breath sounds clear to auscultation       Cardiovascular hypertension, Pt. on medications Normal cardiovascular exam Rhythm:Regular Rate:Normal     Neuro/Psych negative neurological ROS  negative psych ROS   GI/Hepatic negative GI ROS,   Endo/Other  negative endocrine ROS  Renal/GU      Musculoskeletal negative musculoskeletal ROS (+)   Abdominal (+) - obese (BMI 38.6),   Peds  Hematology negative hematology ROS (+)   Anesthesia Other Findings   Reproductive/Obstetrics                           Anesthesia Physical Anesthesia Plan  ASA: 3  Anesthesia Plan: Spinal   Post-op Pain Management: Minimal or no pain anticipated and Regional block   Induction:   PONV Risk Score and Plan: 2 and Treatment may vary due to age or medical condition, Midazolam, Ondansetron and Propofol infusion  Airway Management Planned: Nasal Cannula and Natural Airway  Additional Equipment: None  Intra-op Plan:   Post-operative Plan:   Informed Consent: I have reviewed the patients History and Physical, chart, labs and discussed the procedure including the risks, benefits and alternatives for the proposed anesthesia with the patient or authorized representative who has indicated his/her understanding and acceptance.     Dental advisory given  Plan Discussed with: CRNA and Anesthesiologist  Anesthesia Plan Comments: (Spinal plus adductor canal block)     Anesthesia Quick Evaluation

## 2021-06-20 NOTE — H&P (Signed)
KNEE I&D H&P  Patient is being admitted for right knee irrigation and debridement with evacuation of hematoma.  Subjective:  Chief Complaint:right knee pain.  HPI: Richard Farley, 61 y.o. male, has a history of pain and functional disability in the right knee due to  swelling & pain  and has failed non-surgical conservative treatments for greater than 12 weeks to includeNSAID's and/or analgesics and activity modification.  He reports this knee swelling has been present for years. He does work on his knees. 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  There are no problems to display for this patient.  Past Medical History:  Diagnosis Date   Hypertension    Sleep apnea    CPAP    Past Surgical History:  Procedure Laterality Date   HERNIA REPAIR      No current facility-administered medications for this encounter.   Current Outpatient Medications  Medication Sig Dispense Refill Last Dose   amLODipine (NORVASC) 10 MG tablet Take 10 mg by mouth daily.      cholecalciferol (VITAMIN D3) 25 MCG (1000 UNIT) tablet Take 1,000 Units by mouth daily.      Cinnamon 500 MG TABS Take 500 mg by mouth daily.      Coenzyme Q10 (COQ10 PO) Take 300 mg by mouth daily.      finasteride (PROSCAR) 5 MG tablet Take 1 tablet (5 mg total) by mouth daily. 90 tablet 3    furosemide (LASIX) 40 MG tablet Take 40 mg by mouth daily.      losartan (COZAAR) 100 MG tablet TAKE 1 TABLET BY MOUTH ONCE A DAY 90 tablet 1    metoprolol (TOPROL-XL) 200 MG 24 hr tablet Take 200 mg by mouth daily.      Multiple Vitamins-Minerals (MULTIVITAMIN WITH MINERALS) tablet Take 1 tablet by mouth daily.      NONFORMULARY OR COMPOUNDED ITEM Apply 1 application topically daily. Testosterone Cream      Omega-3 Fatty Acids (FISH OIL) 1000 MG CAPS Take 1,000 mg by mouth daily.       potassium chloride SA (KLOR-CON M) 20 MEQ tablet TAKE 1 TABLET BY MOUTH ONCE A DAY 90 tablet 1    pravastatin (PRAVACHOL) 40 MG tablet TAKE 1 TABLET BY MOUTH ONCE A DAY 90 tablet 1    hydrALAZINE (APRESOLINE) 50 MG tablet Take 1 tablet  by mouth 3 times a day. (Patient not taking: Reported on 06/08/2021) 270 tablet 1 Not Taking   Allergies  Allergen Reactions   Crestor [Rosuvastatin Calcium]     Feels like he has the flu     Social History   Tobacco Use   Smoking status: Never   Smokeless tobacco: Never  Substance Use Topics   Alcohol use: Yes    Comment: OCCAS    No family history on file.   Review of Systems  Constitutional:  Negative for chills and fever.  Respiratory:  Negative for cough and shortness of breath.   Cardiovascular:  Negative for chest pain.  Gastrointestinal:  Negative for nausea and vomiting.  Musculoskeletal:  Positive for arthralgias.    Objective:  Physical Exam Well nourished and well developed. General: Alert and oriented x3, cooperative and pleasant, no acute distress. Head: normocephalic, atraumatic, neck supple. Eyes: EOMI.  Musculoskeletal: Right knee exam: There is no erythema or warmth, no abrasions or other  signs of injury The swelling over his knee is over the anterior aspect of the knee distal to the patella There is not an obvious ballotable fluid in this area as the swelling is rather tense. No significant tenderness to palpation No loss of extension or flexion other than noted tightness with flexion  Calves soft and nontender. Motor function intact in LE. Strength 5/5 LE bilaterally. Neuro: Distal pulses 2+. Sensation to light touch intact in LE.  Vital signs in last 24 hours:    Labs:   Estimated body mass index is 38.58 kg/m as calculated from the following:   Height as of 06/14/21: 6\' 3"  (1.905 m).   Weight as of 06/14/21: 140 kg.   Imaging Review  Imaging: We ordered an MRI of his right knee that was performed  on 05/01/2021. 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 Incidentally there is findings of tearing to the anterior horn and posterior horn lateral meniscus with mild chondral thinning laterally. Mild degeneration of the medial meniscus without discrete tear and mild chondral thinning on the medial femoral condyle. There are some evidence of chondral fissuring within the patellofemoral compartment with slight marrow edema.     Assessment/Plan:  Right knee hematoma  The patient history, physical examination, clinical judgment of the provider and imaging studies are consistent with right knee hematoma of the right knee(s) and irrigation and debridement is deemed medically necessary. The treatment options including medical management, injection therapy were discussed at length. The risks and benefits of I&D were presented and reviewed. The risks due to infection, stiffness, thromboembolic complications and other imponderables were discussed. The patient acknowledged the explanation, agreed to proceed with the plan and consent was signed. Patient is being admitted for inpatient treatment for surgery, pain control, PT, OT, prophylactic antibiotics, VTE prophylaxis, progressive ambulation and ADL's and discharge planning. The patient is planning to be discharged  home.  Risks and benefits of the surgery were discussed with the patient and Dr. Alvan Dame at their previous office visit, and the patient has elected to move forward with the aforementioned surgery. Post-operative care plans were discussed with the patient today and all patient questions were answered. Disposition: Home with wife PCP: Dr. Oswaldo Milian,  Allergies: NKDA Anesthesia Concerns: Prefers spinal & nerve block  BMI: 39.2 Last HgbA1c: Not diabetic    Other: - Hydrocodone, celebrex, tylenol, robaxin   - Planning to return to work on Monday after surgery   Instructed patient on which medications to discontinue 5 days prior to surgery. Will follow-up in office with Dr. Alvan Dame 2 weeks post-op.  Costella Hatcher, PA-C Orthopedic Surgery [facility] [place] [phone]

## 2021-06-21 ENCOUNTER — Ambulatory Visit (HOSPITAL_COMMUNITY)
Admission: RE | Admit: 2021-06-21 | Discharge: 2021-06-21 | Disposition: A | Discharge status: Home / Self Care | Payer: BC Managed Care – PPO | Attending: Orthopedic Surgery | Admitting: Orthopedic Surgery

## 2021-06-21 ENCOUNTER — Encounter (HOSPITAL_COMMUNITY)
Admission: RE | Disposition: A | Discharge status: Home / Self Care | Payer: Self-pay | Source: Home / Self Care | Attending: Orthopedic Surgery

## 2021-06-21 ENCOUNTER — Encounter (HOSPITAL_COMMUNITY): Payer: Self-pay | Admitting: Orthopedic Surgery

## 2021-06-21 ENCOUNTER — Ambulatory Visit (HOSPITAL_COMMUNITY): Payer: BC Managed Care – PPO | Admitting: Physician Assistant

## 2021-06-21 ENCOUNTER — Ambulatory Visit (HOSPITAL_COMMUNITY): Payer: BC Managed Care – PPO | Admitting: Anesthesiology

## 2021-06-21 DIAGNOSIS — S8001XA Contusion of right knee, initial encounter: Secondary | ICD-10-CM

## 2021-06-21 DIAGNOSIS — Z6838 Body mass index (BMI) 38.0-38.9, adult: Secondary | ICD-10-CM | POA: Insufficient documentation

## 2021-06-21 DIAGNOSIS — E669 Obesity, unspecified: Secondary | ICD-10-CM | POA: Insufficient documentation

## 2021-06-21 DIAGNOSIS — G4730 Sleep apnea, unspecified: Secondary | ICD-10-CM | POA: Insufficient documentation

## 2021-06-21 DIAGNOSIS — Z79899 Other long term (current) drug therapy: Secondary | ICD-10-CM | POA: Diagnosis not present

## 2021-06-21 DIAGNOSIS — M7041 Prepatellar bursitis, right knee: Secondary | ICD-10-CM | POA: Insufficient documentation

## 2021-06-21 DIAGNOSIS — Z9889 Other specified postprocedural states: Secondary | ICD-10-CM

## 2021-06-21 DIAGNOSIS — I10 Essential (primary) hypertension: Secondary | ICD-10-CM | POA: Diagnosis not present

## 2021-06-21 LAB — TYPE AND SCREEN
ABO/RH(D): O POS
Antibody Screen: NEGATIVE

## 2021-06-21 SURGERY — EVACUATION, HEMATOMA, KNEE
Anesthesia: Spinal | Site: Knee | Laterality: Right

## 2021-06-21 MED ORDER — OXYCODONE HCL 5 MG/5ML PO SOLN: 5.0000 mg | Freq: Once | ORAL | Status: AC | PRN

## 2021-06-21 MED ORDER — METHOCARBAMOL 500 MG PO TABS: 500.0000 mg | ORAL_TABLET | Freq: Four times a day (QID) | ORAL | Status: DC | PRN

## 2021-06-21 MED ORDER — HYDROCODONE-ACETAMINOPHEN 5-325 MG PO TABS: 1.0000 | ORAL_TABLET | Freq: Four times a day (QID) | ORAL | 0 refills | Status: DC | PRN

## 2021-06-21 MED ORDER — CLONIDINE HCL (ANALGESIA) 100 MCG/ML EP SOLN
EPIDURAL | Status: DC | PRN
  Administered 2021-06-21: 100 ug

## 2021-06-21 MED ORDER — METHOCARBAMOL 500 MG IVPB - SIMPLE MED
INTRAVENOUS | Status: AC
  Filled 2021-06-21: qty 50

## 2021-06-21 MED ORDER — OXYCODONE HCL 5 MG PO TABS
5.0000 mg | ORAL_TABLET | Freq: Once | ORAL | Status: AC | PRN
  Administered 2021-06-21: 5 mg via ORAL

## 2021-06-21 MED ORDER — TRANEXAMIC ACID-NACL 1000-0.7 MG/100ML-% IV SOLN
INTRAVENOUS | Status: AC
  Filled 2021-06-21: qty 100

## 2021-06-21 MED ORDER — ACETAMINOPHEN 10 MG/ML IV SOLN
INTRAVENOUS | Status: AC
  Filled 2021-06-21: qty 100

## 2021-06-21 MED ORDER — LACTATED RINGERS IV BOLUS
500.0000 mL | Freq: Once | INTRAVENOUS | Status: AC
  Administered 2021-06-21: 500 mL via INTRAVENOUS

## 2021-06-21 MED ORDER — ORAL CARE MOUTH RINSE: 15.0000 mL | Freq: Once | OROMUCOSAL | Status: AC

## 2021-06-21 MED ORDER — ONDANSETRON HCL 4 MG/2ML IJ SOLN
INTRAMUSCULAR | Status: AC
  Filled 2021-06-21: qty 2

## 2021-06-21 MED ORDER — ONDANSETRON HCL 4 MG/2ML IJ SOLN: 4.0000 mg | Freq: Once | INTRAMUSCULAR | Status: DC | PRN

## 2021-06-21 MED ORDER — METHOCARBAMOL 500 MG PO TABS: 500.0000 mg | ORAL_TABLET | Freq: Four times a day (QID) | ORAL | 0 refills | Status: DC | PRN

## 2021-06-21 MED ORDER — SODIUM CHLORIDE 0.9 % IR SOLN
Status: DC | PRN
  Administered 2021-06-21: 3000 mL

## 2021-06-21 MED ORDER — CEFAZOLIN IN SODIUM CHLORIDE 3-0.9 GM/100ML-% IV SOLN
INTRAVENOUS | Status: AC
  Filled 2021-06-21: qty 100

## 2021-06-21 MED ORDER — AMISULPRIDE (ANTIEMETIC) 5 MG/2ML IV SOLN: 10.0000 mg | Freq: Once | INTRAVENOUS | Status: DC | PRN

## 2021-06-21 MED ORDER — OXYCODONE HCL 5 MG PO TABS
ORAL_TABLET | ORAL | Status: AC
  Filled 2021-06-21: qty 1

## 2021-06-21 MED ORDER — CEFAZOLIN SODIUM-DEXTROSE 2-4 GM/100ML-% IV SOLN
2.0000 g | INTRAVENOUS | Status: DC
  Filled 2021-06-21: qty 100

## 2021-06-21 MED ORDER — BUPIVACAINE IN DEXTROSE 0.75-8.25 % IT SOLN
INTRATHECAL | Status: DC | PRN
  Administered 2021-06-21: 1.4 mL via INTRATHECAL

## 2021-06-21 MED ORDER — LACTATED RINGERS IV SOLN: INTRAVENOUS | Status: DC

## 2021-06-21 MED ORDER — DEXAMETHASONE SODIUM PHOSPHATE 10 MG/ML IJ SOLN: 8.0000 mg | Freq: Once | INTRAMUSCULAR | Status: DC

## 2021-06-21 MED ORDER — ACETAMINOPHEN 10 MG/ML IV SOLN
1000.0000 mg | Freq: Once | INTRAVENOUS | Status: DC | PRN
  Administered 2021-06-21: 1000 mg via INTRAVENOUS

## 2021-06-21 MED ORDER — DEXAMETHASONE SODIUM PHOSPHATE 10 MG/ML IJ SOLN
INTRAMUSCULAR | Status: AC
  Filled 2021-06-21: qty 1

## 2021-06-21 MED ORDER — POLYETHYLENE GLYCOL 3350 17 G PO PACK: 17.0000 g | PACK | Freq: Two times a day (BID) | ORAL | 0 refills | Status: DC

## 2021-06-21 MED ORDER — CHLORHEXIDINE GLUCONATE 0.12 % MT SOLN
15.0000 mL | Freq: Once | OROMUCOSAL | Status: AC
  Administered 2021-06-21: 15 mL via OROMUCOSAL

## 2021-06-21 MED ORDER — ASPIRIN 81 MG PO CHEW: 81.0000 mg | CHEWABLE_TABLET | Freq: Two times a day (BID) | ORAL | 0 refills | Status: AC

## 2021-06-21 MED ORDER — ROPIVACAINE HCL 5 MG/ML IJ SOLN
INTRAMUSCULAR | Status: DC | PRN
  Administered 2021-06-21: 30 mL via PERINEURAL

## 2021-06-21 MED ORDER — CELECOXIB 200 MG PO CAPS
200.0000 mg | ORAL_CAPSULE | Freq: Two times a day (BID) | ORAL | 0 refills | Status: AC
Start: 2021-06-21 — End: 2021-07-21

## 2021-06-21 MED ORDER — FENTANYL CITRATE PF 50 MCG/ML IJ SOSY
50.0000 ug | PREFILLED_SYRINGE | INTRAMUSCULAR | Status: DC
  Administered 2021-06-21: 50 ug via INTRAVENOUS
  Filled 2021-06-21: qty 2

## 2021-06-21 MED ORDER — CEFAZOLIN SODIUM-DEXTROSE 2-4 GM/100ML-% IV SOLN: 2.0000 g | Freq: Four times a day (QID) | INTRAVENOUS | Status: DC

## 2021-06-21 MED ORDER — METHOCARBAMOL 500 MG IVPB - SIMPLE MED
500.0000 mg | Freq: Four times a day (QID) | INTRAVENOUS | Status: DC | PRN
  Administered 2021-06-21: 500 mg via INTRAVENOUS

## 2021-06-21 MED ORDER — LACTATED RINGERS IV BOLUS: 250.0000 mL | Freq: Once | INTRAVENOUS | Status: DC

## 2021-06-21 MED ORDER — MIDAZOLAM HCL 2 MG/2ML IJ SOLN
1.0000 mg | INTRAMUSCULAR | Status: DC
  Administered 2021-06-21: 2 mg via INTRAVENOUS
  Filled 2021-06-21: qty 2

## 2021-06-21 MED ORDER — POVIDONE-IODINE 10 % EX SWAB: 2.0000 "application " | Freq: Once | CUTANEOUS | Status: DC

## 2021-06-21 MED ORDER — TRANEXAMIC ACID-NACL 1000-0.7 MG/100ML-% IV SOLN
1000.0000 mg | INTRAVENOUS | Status: AC
  Administered 2021-06-21: 1000 mg via INTRAVENOUS
  Filled 2021-06-21: qty 100

## 2021-06-21 MED ORDER — HYDROMORPHONE HCL 1 MG/ML IJ SOLN: 0.2500 mg | INTRAMUSCULAR | Status: DC | PRN

## 2021-06-21 MED ORDER — DOCUSATE SODIUM 100 MG PO CAPS: 100.0000 mg | ORAL_CAPSULE | Freq: Two times a day (BID) | ORAL | Status: DC

## 2021-06-21 MED ORDER — CEFAZOLIN IN SODIUM CHLORIDE 3-0.9 GM/100ML-% IV SOLN
3.0000 g | Freq: Once | INTRAVENOUS | Status: AC
  Administered 2021-06-21: 3 g via INTRAVENOUS

## 2021-06-21 MED ORDER — PROPOFOL 500 MG/50ML IV EMUL
INTRAVENOUS | Status: DC | PRN
  Administered 2021-06-21: 150 ug/kg/min via INTRAVENOUS

## 2021-06-21 MED ORDER — PROPOFOL 500 MG/50ML IV EMUL
INTRAVENOUS | Status: AC
  Filled 2021-06-21: qty 50

## 2021-06-21 MED ORDER — PROPOFOL 10 MG/ML IV BOLUS
INTRAVENOUS | Status: DC | PRN
Start: 2021-06-21 — End: 2021-06-21
  Administered 2021-06-21 (×5): 20 mg via INTRAVENOUS

## 2021-06-21 SURGICAL SUPPLY — 46 items
BNDG ELASTIC 4X5.8 VLCR STR LF (GAUZE/BANDAGES/DRESSINGS) ×2 IMPLANT
BNDG GAUZE ELAST 4 BULKY (GAUZE/BANDAGES/DRESSINGS) ×2 IMPLANT
CUFF TOURN SGL QUICK 18X4 (TOURNIQUET CUFF) ×2 IMPLANT
CUFF TOURN SGL QUICK 24 (TOURNIQUET CUFF)
CUFF TOURN SGL QUICK 34 (TOURNIQUET CUFF)
CUFF TOURN SGL QUICK 42 (TOURNIQUET CUFF) IMPLANT
CUFF TRNQT CYL 24X4X16.5-23 (TOURNIQUET CUFF) IMPLANT
CUFF TRNQT CYL 34X4.125X (TOURNIQUET CUFF) IMPLANT
DERMABOND ADVANCED (GAUZE/BANDAGES/DRESSINGS) ×1
DERMABOND ADVANCED .7 DNX12 (GAUZE/BANDAGES/DRESSINGS) IMPLANT
DRAPE SURG 17X23 STRL (DRAPES) ×2 IMPLANT
DRAPE U-SHAPE 47X51 STRL (DRAPES) ×2 IMPLANT
DRSG AQUACEL AG ADV 3.5X 6 (GAUZE/BANDAGES/DRESSINGS) ×1 IMPLANT
DRSG EMULSION OIL 3X3 NADH (GAUZE/BANDAGES/DRESSINGS) ×2 IMPLANT
DRSG PAD ABDOMINAL 8X10 ST (GAUZE/BANDAGES/DRESSINGS) ×2 IMPLANT
ELECT REM PT RETURN 15FT ADLT (MISCELLANEOUS) IMPLANT
FACESHIELD WRAPAROUND (MASK) ×2 IMPLANT
FACESHIELD WRAPAROUND OR TEAM (MASK) ×1 IMPLANT
GAUZE SPONGE 4X4 12PLY STRL (GAUZE/BANDAGES/DRESSINGS) ×2 IMPLANT
GLOVE SURG ORTHO LTX SZ7.5 (GLOVE) ×2 IMPLANT
GLOVE SURG UNDER POLY LF SZ7.5 (GLOVE) ×2 IMPLANT
GOWN STRL REIN 3XL XLG LVL4 (GOWN DISPOSABLE) ×2 IMPLANT
GOWN STRL REUS W/ TWL LRG LVL3 (GOWN DISPOSABLE) ×3 IMPLANT
GOWN STRL REUS W/TWL LRG LVL3 (GOWN DISPOSABLE) ×3
HANDPIECE INTERPULSE COAX TIP (DISPOSABLE)
KIT BASIN OR (CUSTOM PROCEDURE TRAY) ×2 IMPLANT
KIT TURNOVER KIT A (KITS) IMPLANT
MANIFOLD NEPTUNE II (INSTRUMENTS) ×2 IMPLANT
NS IRRIG 1000ML POUR BTL (IV SOLUTION) ×2 IMPLANT
PACK ORTHO EXTREMITY (CUSTOM PROCEDURE TRAY) ×2 IMPLANT
PAD ARMBOARD 7.5X6 YLW CONV (MISCELLANEOUS) ×4 IMPLANT
SET HNDPC FAN SPRY TIP SCT (DISPOSABLE) IMPLANT
SPONGE T-LAP 18X18 ~~LOC~~+RFID (SPONGE) ×2 IMPLANT
SPONGE T-LAP 4X18 ~~LOC~~+RFID (SPONGE) ×2 IMPLANT
STOCKINETTE 8 INCH (MISCELLANEOUS) ×2 IMPLANT
SUCTION FRAZIER HANDLE 10FR (MISCELLANEOUS)
SUCTION TUBE FRAZIER 10FR DISP (MISCELLANEOUS) IMPLANT
SUT ETHILON 3 0 PS 1 (SUTURE) ×2 IMPLANT
SUT SILK 2 0 PERMA HAND 18 BK (SUTURE) IMPLANT
SWAB CULTURE ESWAB REG 1ML (MISCELLANEOUS) IMPLANT
TOWEL OR 17X26 10 PK STRL BLUE (TOWEL DISPOSABLE) ×2 IMPLANT
TOWEL OR NON WOVEN STRL DISP B (DISPOSABLE) ×2 IMPLANT
TUBING CONNECTING 10 (TUBING) ×2 IMPLANT
UNDERPAD 30X36 HEAVY ABSORB (UNDERPADS AND DIAPERS) ×2 IMPLANT
WATER STERILE IRR 1000ML POUR (IV SOLUTION) ×2 IMPLANT
YANKAUER SUCT BULB TIP NO VENT (SUCTIONS) ×2 IMPLANT

## 2021-06-21 NOTE — Brief Op Note (Signed)
06/21/2021  12:30 PM  PATIENT:  Richard Farley  61 y.o. male  PRE-OPERATIVE DIAGNOSIS:  Right knee chronic prepatellar bursa hematoma  POST-OPERATIVE DIAGNOSIS:  Right knee chronic prepatellar bursa hematoma  PROCEDURE:  Procedure(s) with comments: IRRIGATION AND DEBRIDEMENT, EVACUATION HEMATOMA KNEE (Right) - 90 MINS  Right knee pre-patellar bursectomy  SURGEON:  Surgeon(s) and Role:    Paralee Cancel, MD - Primary  PHYSICIAN ASSISTANT: Costella Hatcher, PA-C  ANESTHESIA:   regional and spinal  EBL:  20 mL   BLOOD ADMINISTERED:none  DRAINS: none   LOCAL MEDICATIONS USED:  NONE  SPECIMEN:  No Specimen  DISPOSITION OF SPECIMEN:  N/A  COUNTS:  YES  TOURNIQUET:   Total Tourniquet Time Documented: Thigh (Right) - 6 minutes Total: Thigh (Right) - 6 minutes   DICTATION: .Other Dictation: Dictation Number [id number]  PLAN OF CARE: Discharge to home after PACU  PATIENT DISPOSITION:  PACU - hemodynamically stable.   Delay start of Pharmacological VTE agent (>24hrs) due to surgical blood loss or risk of bleeding: not applicable

## 2021-06-21 NOTE — Anesthesia Postprocedure Evaluation (Signed)
Anesthesia Post Note  Patient: Richard Farley  Procedure(s) Performed: IRRIGATION AND DEBRIDEMENT, EVACUATION HEMATOMA KNEE (Right: Knee)     Patient location during evaluation: Nursing Unit Anesthesia Type: Spinal Level of consciousness: oriented and awake and alert Pain management: pain level controlled Vital Signs Assessment: post-procedure vital signs reviewed and stable Respiratory status: spontaneous breathing and respiratory function stable Cardiovascular status: blood pressure returned to baseline and stable Postop Assessment: no headache, no backache, no apparent nausea or vomiting and patient able to bend at knees Anesthetic complications: no   No notable events documented.  Last Vitals:  Vitals:   06/21/21 1300 06/21/21 1315  BP: 115/69 126/78  Pulse: (!) 47 (!) 47  Resp: 10 18  Temp:    SpO2: 92% 93%    Last Pain:  Vitals:   06/21/21 1315  TempSrc:   PainSc: 0-No pain                 Barnet Glasgow

## 2021-06-21 NOTE — Anesthesia Procedure Notes (Signed)
Spinal  Patient location during procedure: OR Start time: 06/21/2021 11:45 AM End time: 06/21/2021 11:48 AM Staffing Performed: resident/CRNA  Resident/CRNA: Lind Covert, CRNA Preanesthetic Checklist Completed: patient identified, IV checked, site marked, risks and benefits discussed, surgical consent, monitors and equipment checked, pre-op evaluation and timeout performed Spinal Block Patient position: sitting Prep: DuraPrep Patient monitoring: heart rate, cardiac monitor, continuous pulse ox and blood pressure Approach: midline Location: L3-4 Injection technique: single-shot Needle Needle type: Pencan  Needle gauge: 24 G Needle length: 10 cm Needle insertion depth: 7 cm Assessment Sensory level: T4 Additional Notes Pt sitting position. SAB without difficulty. Pt position to supine

## 2021-06-21 NOTE — Progress Notes (Signed)
Assisted Dr. Andres Shad with Right Knee Adductor Canal block. Side rails up, monitors on throughout procedure. See vital signs in flow sheet. Tolerated Procedure well.

## 2021-06-21 NOTE — Discharge Instructions (Signed)

## 2021-06-21 NOTE — Op Note (Signed)
NAME: Richard Farley, Richard Farley MEDICAL RECORD NO: [id number] ACCOUNT NO: [id number] DATE OF BIRTH: 03-14-1961 FACILITY: [facility] LOCATION: [facility] PHYSICIAN: Pietro Cassis. Alvan Dame, MD  Operative Report   DATE OF PROCEDURE: 06/21/2021  PREOPERATIVE DIAGNOSIS:  Chronic right knee prepatellar bursa, likely hemorrhagic in nature with old blood clot due to the duration and chronicity.  POSTOPERATIVE DIAGNOSIS:  Chronic right knee prepatellar bursa, likely hemorrhagic in nature with old blood clot due to the duration and chronicity.  PROCEDURE:   1.  Right knee prepatellar excisional and non-excisional debridement including a bursectomy of the prepatellar bursa. 2.  Excisional debridement was carried out over a 3-inch incision using a scalpel initially and then the Bovie cautery to excise the bursal tissue. 3.  Non-excisional debridement was carried out using 2 liters of normal saline solution with pulse lavage.  SURGEON:  Pietro Cassis. Alvan Dame, MD.  ASSISTANT:  Costella Hatcher, PA-C.  Note that Ms. Lu Duffel was present for the entirety of the case from preoperative positioning, perioperative management of the operative extremity, general facilitation of the case and primary wound closure.  ANESTHESIA:  Regional plus spinal.  BLOOD LOSS:  Less than 20 mL.  DRAINS:  None.  TOURNIQUET TIME:  Tourniquet was up for 6 minutes at 225 mmHg.  INDICATIONS FOR THE PROCEDURE:  The patient is a 61 year old male who was seen and evaluated in the office for an aggravating swelling to the anterior aspect of his right knee.  It was located in the infrapatellar space, which appeared to be based on  history of possible old prepatellar bursa that had gotten inflamed or was hemorrhagic based on his workload and doing work on his knees.  MRI confirmed that this was not a malignant process, but rather a collection of fluid or old blood.  Based on the  persistence of it and the size of this swelling and the aggravations on activities  of daily living, he wished to have it excised.  I reviewed with him the minimal risk for any infection or DVT.  We did discuss that there is likely a postoperative seroma  that will fill in the space that may need to be aspirated.  Otherwise, we would anticipate resolution once he has healed.  Consent was obtained for management of this large swelling.  DESCRIPTION OF PROCEDURE:  The patient was brought to the operative theater.  Once adequate anesthesia, preoperative antibiotics, Ancef as well as tranexamic acid administered, he was positioned supine.  The right lower extremity was prepped and draped  in a sterile fashion.  A timeout was performed identifying the patient, planned procedure, and extremity.  Evaluation of this large swelling revealed that it somewhat crossed the midline, but was predominantly over the lateral anterior aspect of the knee  in infrapatellar region.  It extended down to the mid sagittal plane and was approximately 2-1/2 inches from proximal to distal in terms of its dimension and then was about 6-8 inches from medial to lateral.  I elected to make an incision more medial as  if he were going to have a knee replacement incision later on in life.  I created soft tissue planes and then I opened up the bursal sac.  What we found was some old seromatous type fluid, but a significant amount of old clotted hematoma.  I was able to  express all this out from the lateral aspect of his knee through the medial incision.  I then used the suction to remove any remaining old blood.  Once  this was done, I used a combination of the knife and the Bovie to perform a bursectomy over anterior  to the lateral aspect of the knee.  I was able to free up the soft tissues, so mobilized the subcutaneous layer.  I let the tourniquet down after 5-6 minutes.  At this point, I was able to obtain hemostasis around the area of the bursal tissue.  We  irrigated the knee with 2 liters of normal saline solution  in the entire extent of the retained blood.  Once this was done, we confirmed there was minimal concern for active bleeding.  We reapproximated the skin in 2 layers using 2-0 Vicryl and a running  Monocryl stitch.  The knee was then cleaned, dried, and dressed sterilely using surgical glue and Aquacel dressing.  He was brought to the recovery room with a bulky dressing placed.    Findings reviewed with his wife.  We will see him back in the office in 2 weeks for reevaluation of his wound and consideration of seroma drainage if need be.   PAA D: 06/21/2021 12:37:45 pm T: 06/21/2021 10:02:00 pm  JOB: [id number]/ [id number]

## 2021-06-21 NOTE — Interval H&P Note (Signed)
History and Physical Interval Note:  06/21/2021 10:27 AM  Richard Farley  has presented today for surgery, with the diagnosis of Right knee chronic hematoma vs chronic morel lavalle lesion.  The various methods of treatment have been discussed with the patient and family. After consideration of risks, benefits and other options for treatment, the patient has consented to  Procedure(s) with comments: [place] (Right) - 90 MINS as a surgical intervention.  The patient's history has been reviewed, patient examined, no change in status, stable for surgery.  I have reviewed the patient's chart and labs.  Questions were answered to the patient's satisfaction.     Mauri Pole

## 2021-06-21 NOTE — Transfer of Care (Signed)
Immediate Anesthesia Transfer of Care Note  Patient: Richard Farley  Procedure(s) Performed: IRRIGATION AND DEBRIDEMENT, EVACUATION HEMATOMA KNEE (Right: Knee)  Patient Location: PACU  Anesthesia Type:Regional and Spinal  Level of Consciousness: awake, alert  and oriented  Airway & Oxygen Therapy: Patient Spontanous Breathing and Patient connected to face mask oxygen  Post-op Assessment: Report given to RN and Post -op Vital signs reviewed and stable  Post vital signs: Reviewed and stable  Last Vitals:  Vitals Value Taken Time  BP 106/61 06/21/21 1241  Temp    Pulse 47 06/21/21 1243  Resp 18 06/21/21 1243  SpO2 100 % 06/21/21 1243  Vitals shown include unvalidated device data.  Last Pain:  Vitals:   06/21/21 1002  TempSrc: Oral  PainSc: 0-No pain         Complications: No notable events documented.

## 2021-06-21 NOTE — Anesthesia Procedure Notes (Signed)
Anesthesia Regional Block: Adductor canal block   Pre-Anesthetic Checklist: , timeout performed,  Correct Patient, Correct Site, Correct Laterality,  Correct Procedure, Correct Position, site marked,  Risks and benefits discussed,  Surgical consent,  Pre-op evaluation,  At surgeon's request and post-op pain management  Laterality: Lower and Right  Prep: chloraprep       Needles:  Injection technique: Single-shot  Needle Type: Echogenic Needle     Needle Length: 9cm  Needle Gauge: 22     Additional Needles:   Procedures:,,,, ultrasound used (permanent image in chart),,    Narrative:  Start time: 06/21/2021 11:05 AM End time: 06/21/2021 11:11 AM Injection made incrementally with aspirations every 5 mL.  Performed by: Personally  Anesthesiologist: Barnet Glasgow, MD  Additional Notes: Block assessed prior to surgery. Pt tolerated procedure well.

## 2021-06-23 ENCOUNTER — Other Ambulatory Visit (HOSPITAL_COMMUNITY): Payer: Self-pay

## 2021-07-06 ENCOUNTER — Other Ambulatory Visit (HOSPITAL_COMMUNITY): Payer: Self-pay

## 2021-07-23 ENCOUNTER — Other Ambulatory Visit (HOSPITAL_COMMUNITY): Payer: Self-pay

## 2021-08-06 ENCOUNTER — Other Ambulatory Visit (HOSPITAL_COMMUNITY): Payer: Self-pay

## 2021-08-06 MED ORDER — LOSARTAN POTASSIUM 100 MG PO TABS
100.0000 mg | ORAL_TABLET | Freq: Every day | ORAL | 0 refills | Status: DC
  Filled 2021-08-06: qty 30, 30d supply, fill #0
  Filled 2021-09-03: qty 30, 30d supply, fill #1
  Filled 2021-10-01: qty 30, 30d supply, fill #2

## 2021-08-20 ENCOUNTER — Other Ambulatory Visit (HOSPITAL_COMMUNITY): Payer: Self-pay

## 2021-08-22 ENCOUNTER — Other Ambulatory Visit (HOSPITAL_COMMUNITY): Payer: Self-pay

## 2021-08-22 MED ORDER — FINASTERIDE 5 MG PO TABS
5.0000 mg | ORAL_TABLET | Freq: Every day | ORAL | 3 refills | Status: DC
  Filled 2021-08-22: qty 30, 30d supply, fill #0

## 2021-09-03 ENCOUNTER — Other Ambulatory Visit (HOSPITAL_COMMUNITY): Payer: Self-pay

## 2021-09-22 ENCOUNTER — Other Ambulatory Visit (HOSPITAL_COMMUNITY): Payer: Self-pay

## 2021-09-22 MED ORDER — PRAVASTATIN SODIUM 40 MG PO TABS
40.0000 mg | ORAL_TABLET | Freq: Every day | ORAL | 1 refills | Status: DC
  Filled 2021-09-22: qty 30, 30d supply, fill #0
  Filled 2021-10-22: qty 30, 30d supply, fill #1
  Filled 2021-11-17: qty 30, 30d supply, fill #2
  Filled 2021-12-20: qty 30, 30d supply, fill #3
  Filled 2022-01-13: qty 30, 30d supply, fill #4
  Filled 2022-02-18: qty 30, 30d supply, fill #5

## 2021-10-01 ENCOUNTER — Other Ambulatory Visit (HOSPITAL_COMMUNITY): Payer: Self-pay

## 2021-10-08 ENCOUNTER — Other Ambulatory Visit (HOSPITAL_COMMUNITY): Payer: Self-pay

## 2021-10-08 MED ORDER — POTASSIUM CHLORIDE CRYS ER 20 MEQ PO TBCR
20.0000 meq | EXTENDED_RELEASE_TABLET | Freq: Every day | ORAL | 1 refills | Status: DC
  Filled 2021-10-08: qty 30, 30d supply, fill #0
  Filled 2021-11-05: qty 30, 30d supply, fill #1
  Filled 2021-12-03: qty 30, 30d supply, fill #2
  Filled 2022-01-02: qty 30, 30d supply, fill #3
  Filled 2022-01-31: qty 30, 30d supply, fill #4
  Filled 2022-03-02: qty 30, 30d supply, fill #5

## 2021-10-22 ENCOUNTER — Other Ambulatory Visit (HOSPITAL_COMMUNITY): Payer: Self-pay

## 2021-11-01 ENCOUNTER — Other Ambulatory Visit (HOSPITAL_COMMUNITY): Payer: Self-pay

## 2021-11-01 MED ORDER — LOSARTAN POTASSIUM 100 MG PO TABS
100.0000 mg | ORAL_TABLET | Freq: Every day | ORAL | 0 refills | Status: DC
  Filled 2021-11-01: qty 30, 30d supply, fill #0
  Filled 2021-11-30: qty 30, 30d supply, fill #1
  Filled 2022-01-02: qty 30, 30d supply, fill #2

## 2021-11-05 ENCOUNTER — Other Ambulatory Visit (HOSPITAL_COMMUNITY): Payer: Self-pay

## 2021-11-17 ENCOUNTER — Other Ambulatory Visit (HOSPITAL_COMMUNITY): Payer: Self-pay

## 2021-11-29 ENCOUNTER — Other Ambulatory Visit (HOSPITAL_COMMUNITY): Payer: Self-pay

## 2021-11-29 MED ORDER — FUROSEMIDE 20 MG PO TABS
20.0000 mg | ORAL_TABLET | Freq: Every day | ORAL | 1 refills | Status: DC
  Filled 2021-11-29: qty 30, 30d supply, fill #0
  Filled 2022-01-31: qty 30, 30d supply, fill #1
  Filled 2022-03-03: qty 30, 30d supply, fill #2
  Filled 2022-04-03: qty 30, 30d supply, fill #3
  Filled 2022-06-04: qty 30, 30d supply, fill #4

## 2021-11-29 MED ORDER — ITRACONAZOLE 100 MG PO CAPS
ORAL_CAPSULE | ORAL | 0 refills | Status: DC
  Filled 2021-11-29: qty 60, 30d supply, fill #0
  Filled 2021-12-27: qty 60, 30d supply, fill #1

## 2021-11-30 ENCOUNTER — Other Ambulatory Visit (HOSPITAL_COMMUNITY): Payer: Self-pay

## 2021-12-03 ENCOUNTER — Other Ambulatory Visit (HOSPITAL_COMMUNITY): Payer: Self-pay

## 2021-12-18 ENCOUNTER — Ambulatory Visit (INDEPENDENT_AMBULATORY_CARE_PROVIDER_SITE_OTHER): Payer: BC Managed Care – PPO | Admitting: Podiatry

## 2021-12-18 DIAGNOSIS — B351 Tinea unguium: Secondary | ICD-10-CM | POA: Diagnosis not present

## 2021-12-18 NOTE — Patient Instructions (Signed)
I have ordered a medication for you that will come from [facility] in . They should be calling you to verify insurance and will mail the medication to you. If you live close by then you can go by their pharmacy to pick up the medication. Their phone number is [phone]. If you do not hear from them in the next few days, please give us a call at [phone].   

## 2021-12-18 NOTE — Progress Notes (Signed)
Subjective:   Patient ID: Richard Farley, male   DOB: 61 y.o.   MRN: [id number]   HPI Chief Complaint  Patient presents with   Nail Problem    2nd toes on both feet possible fungus- patient has taken a round of Lamisil previously and now he is taking Sporanox for about 3 weeks now. Patient wondering about other options.    61 year old male presents with above complaints.  He states he has had fungus on his toes and he previously was on Lamisil he is currently on Sporanox for the last 3 weeks.  He is interested in other treatment options.  No swelling redness or any drainage.  No other concerns.   Review of Systems  All other systems reviewed and are negative.  Past Medical History:  Diagnosis Date   Hypertension    Sleep apnea    CPAP    Past Surgical History:  Procedure Laterality Date   HERNIA REPAIR       Current Outpatient Medications:    amLODipine (NORVASC) 10 MG tablet, Take 10 mg by mouth daily., Disp: , Rfl:    cholecalciferol (VITAMIN D3) 25 MCG (1000 UNIT) tablet, Take 1,000 Units by mouth daily., Disp: , Rfl:    Cinnamon 500 MG TABS, Take 500 mg by mouth daily., Disp: , Rfl:    Coenzyme Q10 (COQ10 PO), Take 300 mg by mouth daily., Disp: , Rfl:    docusate sodium (COLACE) 100 MG capsule, Take 1 capsule (100 mg total) by mouth 2 (two) times daily., Disp: , Rfl:    finasteride (PROSCAR) 5 MG tablet, Take 1 tablet (5 mg total) by mouth daily., Disp: 90 tablet, Rfl: 3   finasteride (PROSCAR) 5 MG tablet, Take 1 tablet (5 mg total) by mouth daily., Disp: 90 tablet, Rfl: 3   furosemide (LASIX) 20 MG tablet, Take 1 tablet by mouth daily., Disp: 90 tablet, Rfl: 1   furosemide (LASIX) 40 MG tablet, Take 40 mg by mouth daily., Disp: , Rfl:    hydrALAZINE (APRESOLINE) 50 MG tablet, Take 1 tablet  by mouth 3 times a day. (Patient not taking: Reported on 06/08/2021), Disp: 270 tablet, Rfl: 1   HYDROcodone-acetaminophen (NORCO/VICODIN) 5-325 MG tablet, Take 1-2 tablets by  mouth every 6 (six) hours as needed for severe pain., Disp: 30 tablet, Rfl: 0   itraconazole (SPORANOX) 100 MG capsule, Take 2 capsule by mouth once daily, Disp: 168 capsule, Rfl: 0   losartan (COZAAR) 100 MG tablet, TAKE 1 TABLET BY MOUTH ONCE A DAY, Disp: 90 tablet, Rfl: 0   methocarbamol (ROBAXIN) 500 MG tablet, Take 1 tablet (500 mg total) by mouth every 6 (six) hours as needed for muscle spasms., Disp: 40 tablet, Rfl: 0   metoprolol (TOPROL-XL) 200 MG 24 hr tablet, Take 200 mg by mouth daily., Disp: , Rfl:    Multiple Vitamins-Minerals (MULTIVITAMIN WITH MINERALS) tablet, Take 1 tablet by mouth daily., Disp: , Rfl:    NONFORMULARY OR COMPOUNDED ITEM, Apply 1 application topically daily. Testosterone Cream, Disp: , Rfl:    polyethylene glycol (MIRALAX / GLYCOLAX) 17 g packet, Take 17 g by mouth 2 (two) times daily., Disp: 14 each, Rfl: 0   potassium chloride SA (KLOR-CON M) 20 MEQ tablet, TAKE 1 TABLET BY MOUTH ONCE A DAY, Disp: 90 tablet, Rfl: 1   pravastatin (PRAVACHOL) 40 MG tablet, TAKE 1 TABLET BY MOUTH ONCE A DAY, Disp: 90 tablet, Rfl: 1  Allergies  Allergen Reactions   Crestor [employer]  Calcium]     Feels like he has the flu           Objective:  Physical Exam  General: AAO x3, NAD  Dermatological: In general nails are dystrophic, hypertrophic with yellow discoloration but in particular the left third as well as the right second nail with the most dystrophic.  There is no edema, erythema, drainage or pus.  No open lesions  Vascular: Dorsalis Pedis artery and Posterior Tibial artery pedal pulses are 2/4 bilateral with immedate capillary fill time. There is no pain with calf compression, swelling, warmth, erythema.   Neruologic: Grossly intact via light touch bilateral.   Musculoskeletal: Muscular strength 5/5 in all groups tested bilateral.  Gait: Unassisted, Nonantalgic.       Assessment:   Onychomycosis     Plan:  -Treatment options discussed including all  alternatives, risks, and complications. -Etiology of symptoms were discussed -On oral medication.  Due to this I did not culture the nail.  Discussed other treatment options including different topicals as well as laser treatment.  Given the thickening the nail not sure how helpful laser will be going to start with a compound cream through [place] that I ordered.  This will hopefully help with the dystrophy but also fungus.  Consider adding laser as well.  Trula Slade DPM

## 2021-12-20 ENCOUNTER — Other Ambulatory Visit (HOSPITAL_COMMUNITY): Payer: Self-pay

## 2021-12-25 ENCOUNTER — Telehealth: Payer: Self-pay

## 2021-12-26 NOTE — Telephone Encounter (Signed)
Left a detailed vm notifying the patient that medication was sent last week and resent yesterday.

## 2021-12-27 ENCOUNTER — Other Ambulatory Visit (HOSPITAL_COMMUNITY): Payer: Self-pay

## 2021-12-28 ENCOUNTER — Other Ambulatory Visit (HOSPITAL_COMMUNITY): Payer: Self-pay

## 2022-01-01 ENCOUNTER — Other Ambulatory Visit (HOSPITAL_COMMUNITY): Payer: Self-pay

## 2022-01-02 ENCOUNTER — Other Ambulatory Visit (HOSPITAL_COMMUNITY): Payer: Self-pay

## 2022-01-14 ENCOUNTER — Other Ambulatory Visit (HOSPITAL_COMMUNITY): Payer: Self-pay

## 2022-01-31 ENCOUNTER — Other Ambulatory Visit (HOSPITAL_COMMUNITY): Payer: Self-pay

## 2022-02-01 ENCOUNTER — Other Ambulatory Visit (HOSPITAL_COMMUNITY): Payer: Self-pay

## 2022-02-01 MED ORDER — ITRACONAZOLE 100 MG PO CAPS
200.0000 mg | ORAL_CAPSULE | Freq: Every day | ORAL | 0 refills | Status: DC
  Filled 2022-02-01: qty 60, 30d supply, fill #0
  Filled 2022-03-06: qty 60, 30d supply, fill #1
  Filled 2022-04-16: qty 48, 24d supply, fill #2

## 2022-02-01 MED ORDER — LOSARTAN POTASSIUM 100 MG PO TABS
100.0000 mg | ORAL_TABLET | Freq: Every day | ORAL | 0 refills | Status: DC
  Filled 2022-02-01: qty 30, 30d supply, fill #0
  Filled 2022-03-02: qty 30, 30d supply, fill #1
  Filled 2022-04-03: qty 30, 30d supply, fill #2

## 2022-02-04 ENCOUNTER — Other Ambulatory Visit (HOSPITAL_COMMUNITY): Payer: Self-pay

## 2022-02-18 ENCOUNTER — Other Ambulatory Visit (HOSPITAL_COMMUNITY): Payer: Self-pay

## 2022-03-02 ENCOUNTER — Other Ambulatory Visit (HOSPITAL_COMMUNITY): Payer: Self-pay

## 2022-03-04 ENCOUNTER — Other Ambulatory Visit (HOSPITAL_COMMUNITY): Payer: Self-pay

## 2022-03-06 ENCOUNTER — Other Ambulatory Visit (HOSPITAL_COMMUNITY): Payer: Self-pay

## 2022-03-07 ENCOUNTER — Other Ambulatory Visit (HOSPITAL_COMMUNITY): Payer: Self-pay

## 2022-03-19 ENCOUNTER — Other Ambulatory Visit (HOSPITAL_COMMUNITY): Payer: Self-pay

## 2022-03-19 MED ORDER — PRAVASTATIN SODIUM 40 MG PO TABS
40.0000 mg | ORAL_TABLET | Freq: Every day | ORAL | 1 refills | Status: AC
  Filled 2022-03-19: qty 30, 30d supply, fill #0
  Filled 2022-04-19: qty 30, 30d supply, fill #1
  Filled 2022-05-18 – 2022-05-21 (×2): qty 30, 30d supply, fill #2
  Filled 2022-06-19: qty 30, 30d supply, fill #3

## 2022-04-03 ENCOUNTER — Other Ambulatory Visit (HOSPITAL_COMMUNITY): Payer: Self-pay

## 2022-04-04 ENCOUNTER — Other Ambulatory Visit (HOSPITAL_COMMUNITY): Payer: Self-pay

## 2022-04-04 MED ORDER — POTASSIUM CHLORIDE CRYS ER 20 MEQ PO TBCR
20.0000 meq | EXTENDED_RELEASE_TABLET | Freq: Every day | ORAL | 1 refills | Status: DC
  Filled 2022-04-04: qty 30, 30d supply, fill #0
  Filled 2022-05-02: qty 30, 30d supply, fill #1
  Filled 2022-06-04: qty 30, 30d supply, fill #2

## 2022-04-16 ENCOUNTER — Other Ambulatory Visit (HOSPITAL_COMMUNITY): Payer: Self-pay

## 2022-04-17 ENCOUNTER — Other Ambulatory Visit (HOSPITAL_COMMUNITY): Payer: Self-pay

## 2022-04-19 ENCOUNTER — Other Ambulatory Visit (HOSPITAL_COMMUNITY): Payer: Self-pay

## 2022-04-22 DIAGNOSIS — E291 Testicular hypofunction: Secondary | ICD-10-CM | POA: Diagnosis not present

## 2022-04-22 DIAGNOSIS — N401 Enlarged prostate with lower urinary tract symptoms: Secondary | ICD-10-CM | POA: Diagnosis not present

## 2022-04-22 DIAGNOSIS — N138 Other obstructive and reflux uropathy: Secondary | ICD-10-CM | POA: Diagnosis not present

## 2022-04-22 DIAGNOSIS — N529 Male erectile dysfunction, unspecified: Secondary | ICD-10-CM | POA: Diagnosis not present

## 2022-04-22 DIAGNOSIS — Z87898 Personal history of other specified conditions: Secondary | ICD-10-CM | POA: Diagnosis not present

## 2022-05-02 ENCOUNTER — Other Ambulatory Visit (HOSPITAL_COMMUNITY): Payer: Self-pay

## 2022-05-03 ENCOUNTER — Other Ambulatory Visit: Payer: Self-pay

## 2022-05-03 ENCOUNTER — Encounter (HOSPITAL_COMMUNITY): Payer: Self-pay

## 2022-05-03 ENCOUNTER — Other Ambulatory Visit (HOSPITAL_COMMUNITY): Payer: Self-pay

## 2022-05-03 MED ORDER — LOSARTAN POTASSIUM 100 MG PO TABS
100.0000 mg | ORAL_TABLET | Freq: Every day | ORAL | 0 refills | Status: DC
  Filled 2022-05-03: qty 30, 30d supply, fill #0
  Filled 2022-06-04: qty 30, 30d supply, fill #1

## 2022-05-18 ENCOUNTER — Other Ambulatory Visit (HOSPITAL_COMMUNITY): Payer: Self-pay

## 2022-05-21 ENCOUNTER — Other Ambulatory Visit (HOSPITAL_COMMUNITY): Payer: Self-pay

## 2022-05-21 ENCOUNTER — Other Ambulatory Visit: Payer: Self-pay

## 2022-05-21 MED ORDER — ITRACONAZOLE 100 MG PO CAPS
200.0000 mg | ORAL_CAPSULE | Freq: Every day | ORAL | 0 refills | Status: DC
  Filled 2022-05-21 (×2): qty 60, 30d supply, fill #0
  Filled 2022-06-19: qty 60, 30d supply, fill #1

## 2022-05-22 ENCOUNTER — Other Ambulatory Visit: Payer: Self-pay

## 2022-05-22 ENCOUNTER — Other Ambulatory Visit (HOSPITAL_COMMUNITY): Payer: Self-pay

## 2022-05-31 ENCOUNTER — Other Ambulatory Visit (HOSPITAL_COMMUNITY): Payer: Self-pay

## 2022-06-04 ENCOUNTER — Other Ambulatory Visit (HOSPITAL_COMMUNITY): Payer: Self-pay

## 2022-06-20 ENCOUNTER — Other Ambulatory Visit (HOSPITAL_COMMUNITY): Payer: Self-pay

## 2022-06-20 ENCOUNTER — Other Ambulatory Visit: Payer: Self-pay

## 2022-06-25 DIAGNOSIS — E291 Testicular hypofunction: Secondary | ICD-10-CM | POA: Diagnosis not present

## 2022-06-25 DIAGNOSIS — E876 Hypokalemia: Secondary | ICD-10-CM | POA: Diagnosis not present

## 2022-06-25 DIAGNOSIS — R972 Elevated prostate specific antigen [PSA]: Secondary | ICD-10-CM | POA: Diagnosis not present

## 2022-06-25 DIAGNOSIS — Z131 Encounter for screening for diabetes mellitus: Secondary | ICD-10-CM | POA: Diagnosis not present

## 2022-06-25 DIAGNOSIS — I10 Essential (primary) hypertension: Secondary | ICD-10-CM | POA: Diagnosis not present

## 2022-06-25 DIAGNOSIS — E785 Hyperlipidemia, unspecified: Secondary | ICD-10-CM | POA: Diagnosis not present

## 2022-06-27 ENCOUNTER — Other Ambulatory Visit (HOSPITAL_COMMUNITY): Payer: Self-pay

## 2022-06-27 DIAGNOSIS — R972 Elevated prostate specific antigen [PSA]: Secondary | ICD-10-CM | POA: Diagnosis not present

## 2022-06-27 DIAGNOSIS — R0683 Snoring: Secondary | ICD-10-CM | POA: Diagnosis not present

## 2022-06-27 DIAGNOSIS — R739 Hyperglycemia, unspecified: Secondary | ICD-10-CM | POA: Diagnosis not present

## 2022-06-27 DIAGNOSIS — R609 Edema, unspecified: Secondary | ICD-10-CM | POA: Diagnosis not present

## 2022-06-27 MED ORDER — HYDROCHLOROTHIAZIDE 25 MG PO TABS
25.0000 mg | ORAL_TABLET | Freq: Every day | ORAL | 1 refills | Status: DC
  Filled 2022-06-27: qty 30, 30d supply, fill #0

## 2022-06-28 ENCOUNTER — Other Ambulatory Visit: Payer: Self-pay

## 2022-08-05 DIAGNOSIS — L814 Other melanin hyperpigmentation: Secondary | ICD-10-CM | POA: Diagnosis not present

## 2022-08-05 DIAGNOSIS — L918 Other hypertrophic disorders of the skin: Secondary | ICD-10-CM | POA: Diagnosis not present

## 2022-08-05 DIAGNOSIS — D225 Melanocytic nevi of trunk: Secondary | ICD-10-CM | POA: Diagnosis not present

## 2022-08-05 DIAGNOSIS — I781 Nevus, non-neoplastic: Secondary | ICD-10-CM | POA: Diagnosis not present

## 2022-08-27 DIAGNOSIS — R5383 Other fatigue: Secondary | ICD-10-CM | POA: Diagnosis not present

## 2022-08-27 DIAGNOSIS — E782 Mixed hyperlipidemia: Secondary | ICD-10-CM | POA: Diagnosis not present

## 2022-08-27 DIAGNOSIS — G4733 Obstructive sleep apnea (adult) (pediatric): Secondary | ICD-10-CM | POA: Diagnosis not present

## 2022-08-27 DIAGNOSIS — I10 Essential (primary) hypertension: Secondary | ICD-10-CM | POA: Diagnosis not present

## 2022-08-27 DIAGNOSIS — R5381 Other malaise: Secondary | ICD-10-CM | POA: Diagnosis not present

## 2022-08-27 DIAGNOSIS — R001 Bradycardia, unspecified: Secondary | ICD-10-CM | POA: Diagnosis not present

## 2022-09-17 DIAGNOSIS — I440 Atrioventricular block, first degree: Secondary | ICD-10-CM | POA: Diagnosis not present

## 2022-09-17 DIAGNOSIS — I491 Atrial premature depolarization: Secondary | ICD-10-CM | POA: Diagnosis not present

## 2022-09-17 DIAGNOSIS — I493 Ventricular premature depolarization: Secondary | ICD-10-CM | POA: Diagnosis not present

## 2022-09-17 DIAGNOSIS — I4719 Other supraventricular tachycardia: Secondary | ICD-10-CM | POA: Diagnosis not present

## 2022-09-24 DIAGNOSIS — G4733 Obstructive sleep apnea (adult) (pediatric): Secondary | ICD-10-CM | POA: Diagnosis not present

## 2022-10-17 ENCOUNTER — Other Ambulatory Visit (HOSPITAL_COMMUNITY): Payer: Self-pay

## 2022-10-17 DIAGNOSIS — I10 Essential (primary) hypertension: Secondary | ICD-10-CM | POA: Diagnosis not present

## 2022-10-17 DIAGNOSIS — Z0000 Encounter for general adult medical examination without abnormal findings: Secondary | ICD-10-CM | POA: Diagnosis not present

## 2022-10-17 DIAGNOSIS — E782 Mixed hyperlipidemia: Secondary | ICD-10-CM | POA: Diagnosis not present

## 2022-10-17 DIAGNOSIS — E876 Hypokalemia: Secondary | ICD-10-CM | POA: Diagnosis not present

## 2022-10-17 DIAGNOSIS — Z23 Encounter for immunization: Secondary | ICD-10-CM | POA: Diagnosis not present

## 2022-10-17 DIAGNOSIS — I872 Venous insufficiency (chronic) (peripheral): Secondary | ICD-10-CM | POA: Diagnosis not present

## 2022-10-17 MED ORDER — CLONIDINE HCL 0.2 MG PO TABS
0.2000 mg | ORAL_TABLET | Freq: Two times a day (BID) | ORAL | 1 refills | Status: DC
  Filled 2022-10-17: qty 60, 30d supply, fill #0
  Filled 2022-11-13: qty 60, 30d supply, fill #1

## 2022-10-18 ENCOUNTER — Other Ambulatory Visit: Payer: Self-pay

## 2022-10-25 DIAGNOSIS — G4733 Obstructive sleep apnea (adult) (pediatric): Secondary | ICD-10-CM | POA: Diagnosis not present

## 2022-11-14 ENCOUNTER — Other Ambulatory Visit (HOSPITAL_COMMUNITY): Payer: Self-pay

## 2022-11-14 DIAGNOSIS — I10 Essential (primary) hypertension: Secondary | ICD-10-CM | POA: Diagnosis not present

## 2022-11-14 DIAGNOSIS — R001 Bradycardia, unspecified: Secondary | ICD-10-CM | POA: Diagnosis not present

## 2022-11-14 DIAGNOSIS — E782 Mixed hyperlipidemia: Secondary | ICD-10-CM | POA: Diagnosis not present

## 2022-11-14 DIAGNOSIS — G4733 Obstructive sleep apnea (adult) (pediatric): Secondary | ICD-10-CM | POA: Diagnosis not present

## 2022-11-24 DIAGNOSIS — G4733 Obstructive sleep apnea (adult) (pediatric): Secondary | ICD-10-CM | POA: Diagnosis not present

## 2022-12-11 DIAGNOSIS — L03115 Cellulitis of right lower limb: Secondary | ICD-10-CM | POA: Diagnosis not present

## 2023-01-23 DIAGNOSIS — E782 Mixed hyperlipidemia: Secondary | ICD-10-CM | POA: Diagnosis not present

## 2023-01-23 DIAGNOSIS — R739 Hyperglycemia, unspecified: Secondary | ICD-10-CM | POA: Diagnosis not present

## 2023-01-23 DIAGNOSIS — E876 Hypokalemia: Secondary | ICD-10-CM | POA: Diagnosis not present

## 2023-01-27 DIAGNOSIS — E782 Mixed hyperlipidemia: Secondary | ICD-10-CM | POA: Diagnosis not present

## 2023-01-27 DIAGNOSIS — R739 Hyperglycemia, unspecified: Secondary | ICD-10-CM | POA: Diagnosis not present

## 2023-01-27 DIAGNOSIS — I10 Essential (primary) hypertension: Secondary | ICD-10-CM | POA: Diagnosis not present

## 2023-01-27 DIAGNOSIS — Z23 Encounter for immunization: Secondary | ICD-10-CM | POA: Diagnosis not present

## 2023-01-27 DIAGNOSIS — E876 Hypokalemia: Secondary | ICD-10-CM | POA: Diagnosis not present

## 2023-02-18 DIAGNOSIS — G4733 Obstructive sleep apnea (adult) (pediatric): Secondary | ICD-10-CM | POA: Diagnosis not present

## 2023-02-18 DIAGNOSIS — I10 Essential (primary) hypertension: Secondary | ICD-10-CM | POA: Diagnosis not present

## 2023-02-18 DIAGNOSIS — R001 Bradycardia, unspecified: Secondary | ICD-10-CM | POA: Diagnosis not present

## 2023-02-18 DIAGNOSIS — E782 Mixed hyperlipidemia: Secondary | ICD-10-CM | POA: Diagnosis not present

## 2023-04-01 DIAGNOSIS — I8311 Varicose veins of right lower extremity with inflammation: Secondary | ICD-10-CM | POA: Diagnosis not present

## 2023-04-01 DIAGNOSIS — I87393 Chronic venous hypertension (idiopathic) with other complications of bilateral lower extremity: Secondary | ICD-10-CM | POA: Diagnosis not present

## 2023-04-01 DIAGNOSIS — I83893 Varicose veins of bilateral lower extremities with other complications: Secondary | ICD-10-CM | POA: Diagnosis not present

## 2023-04-01 DIAGNOSIS — M79661 Pain in right lower leg: Secondary | ICD-10-CM | POA: Diagnosis not present

## 2023-04-01 DIAGNOSIS — I8312 Varicose veins of left lower extremity with inflammation: Secondary | ICD-10-CM | POA: Diagnosis not present

## 2023-04-08 DIAGNOSIS — I8311 Varicose veins of right lower extremity with inflammation: Secondary | ICD-10-CM | POA: Diagnosis not present

## 2023-04-08 DIAGNOSIS — L819 Disorder of pigmentation, unspecified: Secondary | ICD-10-CM | POA: Diagnosis not present

## 2023-04-08 DIAGNOSIS — R600 Localized edema: Secondary | ICD-10-CM | POA: Diagnosis not present

## 2023-04-08 DIAGNOSIS — I87391 Chronic venous hypertension (idiopathic) with other complications of right lower extremity: Secondary | ICD-10-CM | POA: Diagnosis not present

## 2023-04-08 DIAGNOSIS — I8312 Varicose veins of left lower extremity with inflammation: Secondary | ICD-10-CM | POA: Diagnosis not present

## 2023-04-15 DIAGNOSIS — N401 Enlarged prostate with lower urinary tract symptoms: Secondary | ICD-10-CM | POA: Diagnosis not present

## 2023-04-15 DIAGNOSIS — R972 Elevated prostate specific antigen [PSA]: Secondary | ICD-10-CM | POA: Diagnosis not present

## 2023-04-15 DIAGNOSIS — E291 Testicular hypofunction: Secondary | ICD-10-CM | POA: Diagnosis not present

## 2023-04-15 DIAGNOSIS — N138 Other obstructive and reflux uropathy: Secondary | ICD-10-CM | POA: Diagnosis not present

## 2023-05-01 DIAGNOSIS — I83892 Varicose veins of left lower extremities with other complications: Secondary | ICD-10-CM | POA: Diagnosis not present

## 2023-05-15 ENCOUNTER — Ambulatory Visit: Payer: BC Managed Care – PPO | Admitting: Podiatry

## 2023-06-05 ENCOUNTER — Ambulatory Visit (INDEPENDENT_AMBULATORY_CARE_PROVIDER_SITE_OTHER): Payer: BC Managed Care – PPO

## 2023-06-05 ENCOUNTER — Ambulatory Visit (INDEPENDENT_AMBULATORY_CARE_PROVIDER_SITE_OTHER): Payer: BC Managed Care – PPO | Admitting: Podiatry

## 2023-06-05 ENCOUNTER — Encounter: Payer: Self-pay | Admitting: Podiatry

## 2023-06-05 DIAGNOSIS — M778 Other enthesopathies, not elsewhere classified: Secondary | ICD-10-CM | POA: Diagnosis not present

## 2023-06-05 DIAGNOSIS — M79671 Pain in right foot: Secondary | ICD-10-CM

## 2023-06-05 DIAGNOSIS — M79672 Pain in left foot: Secondary | ICD-10-CM | POA: Diagnosis not present

## 2023-06-05 NOTE — Progress Notes (Signed)
       Chief Complaint  Patient presents with   Foot Orthotics    Patient will like to talk to doctor to see his suggestions of getting inserts his self .    HPI: 63 y.o. male presents today with general bilateral foot pain.  He is primarily interested in orthotics.  He does state that his feet become sore with prolonged weightbearing activity.  He states that his wife recently got custom inserts and he wants to know if these would be beneficial for him as well.  Past Medical History:  Diagnosis Date   Hypertension    Sleep apnea    CPAP    Past Surgical History:  Procedure Laterality Date   HERNIA REPAIR      Allergies  Allergen Reactions   Crestor [Rosuvastatin Calcium]     Feels like he has the flu     ROS    Physical Exam: There were no vitals filed for this visit.  General: The patient is alert and oriented x3 in no acute distress.  Dermatology: Skin is warm, dry and supple bilateral lower extremities. Interspaces are clear of maceration and debris.    Vascular: Palpable pedal pulses bilaterally. Capillary refill within normal limits.  +1 pitting edema present.  Varicosities and telangiectasias present.  No erythema or calor.  Neurological: Light touch sensation grossly intact bilateral feet.   Musculoskeletal Exam: Muscle strength 5/5 in dorsiflexion, plantarflexion, inversion, eversion.  No symptomatic limitations in pedal range of motion. Over all rectus foot type.  Radiographic Exam: Left and right foot radiographs 3 weightbearing views 06/05/2023 Normal osseous mineralization. Joint spaces preserved.  No fractures or osseous irregularities noted. Overall rectus foot type. There is spurring present to plantar and calcaneal tubercle and posterior calcaneal tuberosity bilaterally.  Mild TN and midfoot spurring bilaterally.  Assessment/Plan of Care: 1. Capsulitis of foot      No orders of the defined types were placed in this encounter.  None  Discussed  clinical findings with patient today.  Radiographs reviewed with the patient.  Discussed benefits of custom foot orthotics with the patient.  Believe they would be beneficial for prolonged weightbearing activity and added support  -Did discuss that these can come with large out-of-pocket cost.  He may want to try a good pair of over-the-counter inserts before hand to see if he notices significant benefit.  -Advised patient to try the over-the-counter inserts.  He may contact me if he would like to proceed with custom foot orthotics.  Follow-up as needed. Ahsha Hinsley L. Marchia Bond, AACFAS [facility]     [address] [place], [place] [address]                Office [phone]  Fax [phone]

## 2023-06-10 DIAGNOSIS — I8311 Varicose veins of right lower extremity with inflammation: Secondary | ICD-10-CM | POA: Diagnosis not present

## 2023-06-10 DIAGNOSIS — I83892 Varicose veins of left lower extremities with other complications: Secondary | ICD-10-CM | POA: Diagnosis not present

## 2023-06-10 DIAGNOSIS — R600 Localized edema: Secondary | ICD-10-CM | POA: Diagnosis not present

## 2023-06-10 DIAGNOSIS — I8312 Varicose veins of left lower extremity with inflammation: Secondary | ICD-10-CM | POA: Diagnosis not present

## 2023-07-08 DIAGNOSIS — E782 Mixed hyperlipidemia: Secondary | ICD-10-CM | POA: Diagnosis not present

## 2023-07-08 DIAGNOSIS — R739 Hyperglycemia, unspecified: Secondary | ICD-10-CM | POA: Diagnosis not present

## 2023-07-08 DIAGNOSIS — I10 Essential (primary) hypertension: Secondary | ICD-10-CM | POA: Diagnosis not present

## 2023-07-10 ENCOUNTER — Other Ambulatory Visit (HOSPITAL_COMMUNITY): Payer: Self-pay

## 2023-07-10 ENCOUNTER — Other Ambulatory Visit: Payer: Self-pay

## 2023-07-10 DIAGNOSIS — N400 Enlarged prostate without lower urinary tract symptoms: Secondary | ICD-10-CM | POA: Diagnosis not present

## 2023-07-10 DIAGNOSIS — R001 Bradycardia, unspecified: Secondary | ICD-10-CM | POA: Diagnosis not present

## 2023-07-10 DIAGNOSIS — I10 Essential (primary) hypertension: Secondary | ICD-10-CM | POA: Diagnosis not present

## 2023-07-10 DIAGNOSIS — I1A0 Resistant hypertension: Secondary | ICD-10-CM | POA: Diagnosis not present

## 2023-07-10 MED ORDER — CLONIDINE HCL 0.3 MG PO TABS
0.3000 mg | ORAL_TABLET | Freq: Two times a day (BID) | ORAL | 1 refills | Status: DC
  Filled 2023-07-10: qty 60, 30d supply, fill #0
  Filled 2023-10-10: qty 60, 30d supply, fill #1

## 2023-07-10 MED ORDER — LABETALOL HCL 200 MG PO TABS
200.0000 mg | ORAL_TABLET | Freq: Two times a day (BID) | ORAL | 1 refills | Status: DC
  Filled 2023-07-10: qty 60, 30d supply, fill #0

## 2023-07-10 MED ORDER — DOXAZOSIN MESYLATE 4 MG PO TABS
4.0000 mg | ORAL_TABLET | Freq: Every day | ORAL | 1 refills | Status: DC
  Filled 2023-07-10: qty 30, 30d supply, fill #0
  Filled 2023-07-31 – 2023-08-04 (×2): qty 30, 30d supply, fill #1
  Filled 2023-09-12: qty 30, 30d supply, fill #2

## 2023-07-10 MED ORDER — HYDROCHLOROTHIAZIDE 50 MG PO TABS
50.0000 mg | ORAL_TABLET | Freq: Every day | ORAL | 1 refills | Status: DC
  Filled 2023-07-10: qty 30, 30d supply, fill #0

## 2023-07-31 ENCOUNTER — Other Ambulatory Visit (HOSPITAL_COMMUNITY): Payer: Self-pay

## 2023-08-04 ENCOUNTER — Other Ambulatory Visit (HOSPITAL_COMMUNITY): Payer: Self-pay

## 2023-08-04 ENCOUNTER — Other Ambulatory Visit: Payer: Self-pay

## 2023-08-20 DIAGNOSIS — I83892 Varicose veins of left lower extremities with other complications: Secondary | ICD-10-CM | POA: Diagnosis not present

## 2023-08-20 DIAGNOSIS — R600 Localized edema: Secondary | ICD-10-CM | POA: Diagnosis not present

## 2023-08-20 DIAGNOSIS — I8311 Varicose veins of right lower extremity with inflammation: Secondary | ICD-10-CM | POA: Diagnosis not present

## 2023-08-20 DIAGNOSIS — I8312 Varicose veins of left lower extremity with inflammation: Secondary | ICD-10-CM | POA: Diagnosis not present

## 2023-09-09 ENCOUNTER — Ambulatory Visit (HOSPITAL_BASED_OUTPATIENT_CLINIC_OR_DEPARTMENT_OTHER): Payer: BC Managed Care – PPO | Admitting: Cardiovascular Disease

## 2023-09-11 ENCOUNTER — Encounter (HOSPITAL_BASED_OUTPATIENT_CLINIC_OR_DEPARTMENT_OTHER): Payer: Self-pay | Admitting: Family

## 2023-09-11 ENCOUNTER — Ambulatory Visit (INDEPENDENT_AMBULATORY_CARE_PROVIDER_SITE_OTHER): Payer: BC Managed Care – PPO | Admitting: Family

## 2023-09-11 VITALS — BP 150/106 | HR 54 | Ht 75.0 in | Wt 290.0 lb

## 2023-09-11 DIAGNOSIS — E876 Hypokalemia: Secondary | ICD-10-CM

## 2023-09-11 DIAGNOSIS — G4733 Obstructive sleep apnea (adult) (pediatric): Secondary | ICD-10-CM

## 2023-09-11 DIAGNOSIS — R001 Bradycardia, unspecified: Secondary | ICD-10-CM

## 2023-09-11 DIAGNOSIS — I10 Essential (primary) hypertension: Secondary | ICD-10-CM

## 2023-09-11 MED ORDER — VALSARTAN 320 MG PO TABS: 320.0000 mg | ORAL_TABLET | Freq: Every day | ORAL | 11 refills | Status: AC

## 2023-09-11 NOTE — Progress Notes (Signed)
 [facility] Initial Assessment:    Date:  09/11/2023   ID:  Richard Farley, DOB Mar 31, 1961, MRN [id number]  PCP:  Richard Dam, MD  Cardiologist:  None  Nephrologist:  Referring MD: Richard Dam, MD   CC: Hypertension  History of Present Illness:    Richard Farley is a 63 y.o. male with a hx of HTN, bradycardia, BPH, OSA here to establish care in the [facility].   Prior echo 08/01/2021 normal LVEF 60 to 65%, mild LVH, RV mildly dilated with normal function, bilateral atria mildly dilated, tricuspid aortic valve with no stenosis no regurgitation, trace MR/TR, mildly dilated ascending aorta 42mm.  Previously evaluated by [facility] cardiology 08/2022 after referral from primary care provider for bradycardia.  Zio patch 08/2022 predominantly normal sinus rhythm with average heart rate 59 bpm, first-degree AV block present, 1 run of SVT lasting 6 beats with a max rate of 133 bpm which was asymptomatic.  Toprol was decreased from 100 mg to 25 mg daily.  His bradycardia was asymptomatic.  Labs via Care Everywhere 06/25/22: Total cholesterol 172, triglycerides 271, hdl 37, ldl 81  Discussed the use of AI scribe software for clinical note transcription with the patient, who gave verbal consent to proceed.  History of Present Illness The patient, with a history of hypertension, presents to the clinic for evaluation of his high blood pressure. he patient also has a family history of hypertension in his sister. Despite being on multiple medications, the patient's blood pressure remains difficult to control particularly over the last year. The patient reports feeling lightheaded, especially when active without near syncope nor syncope. Drinks predominantly water during the day. Prior intolerance to Amlodipine 10mg  with LE edema (lower dose never trialed) and Rosuvastatin with GI upset. Does tolerate Pravastatin . The patient is active,  works on a farm, and is a Chartered certified accountant by trade. The patient does not smoke and drinks alcohol 4-5 beers per week. Eats at home and follows low sodium diet. T Does note feeling sluggish on Clonidine  and prefers to wean off. Also has preference for once daily agents as difficult to keep to 12h dosing with his schedule.     Previous antihypertensives: Amlodipine - swelling on 10mg  dose, lower dose never trialed Metoprolol - bradycardia Labetolol - bradycardia   Past Medical History:  Diagnosis Date   Hyperlipidemia    Hypertension    Sleep apnea    CPAP    Past Surgical History:  Procedure Laterality Date   HERNIA REPAIR     OTOPLASTY     TYMPANOSTOMY TUBE PLACEMENT Right 01/13/2017   WISDOM TOOTH EXTRACTION      Current Medications: Current Meds  Medication Sig   cholecalciferol (VITAMIN D3) 25 MCG (1000 UNIT) tablet Take 1,000 Units by mouth daily.   Cinnamon 500 MG TABS Take 500 mg by mouth daily.   cloNIDine  (CATAPRES ) 0.3 MG tablet Take 1 tablet (0.3 mg total) by mouth 2 (two) times daily.   doxazosin  (CARDURA ) 4 MG tablet Take 1 tablet (4 mg total) by mouth at bedtime.   hydrochlorothiazide  (HYDRODIURIL ) 50 MG tablet Take 1 tablet (50 mg total) by mouth daily.   Multiple Vitamins-Minerals (MULTIVITAMIN WITH MINERALS) tablet Take 1 tablet by mouth daily.   NONFORMULARY OR COMPOUNDED ITEM Apply 1 application topically daily. Testosterone  Cream   potassium chloride  SA (KLOR-CON  M) 20 MEQ tablet Take 1 tablet (20 mEq total) by mouth daily.   pravastatin  (PRAVACHOL ) 40 MG  tablet Take 1 tablet (40 mg total) by mouth daily.   valsartan  (DIOVAN ) 320 MG tablet Take 1 tablet (320 mg total) by mouth daily.   [DISCONTINUED] amLODipine (NORVASC) 10 MG tablet Take 10 mg by mouth daily.   [DISCONTINUED] Coenzyme Q10 (COQ10 PO) Take 300 mg by mouth daily.   [DISCONTINUED] docusate sodium  (COLACE) 100 MG capsule Take 1 capsule (100 mg total) by mouth 2 (two) times daily.   [DISCONTINUED]  finasteride  (PROSCAR ) 5 MG tablet Take 1 tablet (5 mg total) by mouth daily.   [DISCONTINUED] finasteride  (PROSCAR ) 5 MG tablet Take 1 tablet (5 mg total) by mouth daily.   [DISCONTINUED] furosemide  (LASIX ) 20 MG tablet Take 1 tablet by mouth daily.   [DISCONTINUED] furosemide  (LASIX ) 40 MG tablet Take 40 mg by mouth daily.   [DISCONTINUED] hydrALAZINE  (APRESOLINE ) 50 MG tablet Take 1 tablet  by mouth 3 times a day. (Patient taking differently: Take 50 mg by mouth daily.)   [DISCONTINUED] hydrochlorothiazide  (HYDRODIURIL ) 25 MG tablet Take 1 tablet (25 mg total) by mouth daily.   [DISCONTINUED] HYDROcodone -acetaminophen  (NORCO/VICODIN) 5-325 MG tablet Take 1-2 tablets by mouth every 6 (six) hours as needed for severe pain.   [DISCONTINUED] itraconazole  (SPORANOX ) 100 MG capsule Take 2 capsules (200 mg total) by mouth daily.   [DISCONTINUED] losartan  (COZAAR ) 100 MG tablet Take 1 tablet (100 mg total) by mouth daily.   [DISCONTINUED] methocarbamol  (ROBAXIN ) 500 MG tablet Take 1 tablet (500 mg total) by mouth every 6 (six) hours as needed for muscle spasms.   [DISCONTINUED] metoprolol (TOPROL-XL) 200 MG 24 hr tablet Take 200 mg by mouth daily.   [DISCONTINUED] polyethylene glycol (MIRALAX  / GLYCOLAX ) 17 g packet Take 17 g by mouth 2 (two) times daily.     Allergies:   Crestor [rosuvastatin calcium]   Social History   Socioeconomic History   Marital status: Married    Spouse name: Not on file   Number of children: Not on file   Years of education: Not on file   Highest education level: Not on file  Occupational History   Not on file  Tobacco Use   Smoking status: Never   Smokeless tobacco: Never  Vaping Use   Vaping status: Never Used  Substance and Sexual Activity   Alcohol use: Yes    Comment: OCCAS   Drug use: Never   Sexual activity: Not on file  Other Topics Concern   Not on file  Social History Narrative   Not on file   Social Drivers of Health   Financial Resource  Strain: Not on file  Food Insecurity: Low Risk  (07/10/2023)   Received from [facility]   Hunger Vital Sign    Worried About Running Out of Food in the Last Year: Never true    Ran Out of Food in the Last Year: Never true  Transportation Needs: No Transportation Needs (07/10/2023)   Received from [facility]    In the past 12 months, has lack of reliable transportation kept you from medical appointments, meetings, work or from getting things needed for daily living? : No  Physical Activity: Not on file  Stress: Not on file  Social Connections: Unknown (10/02/2021)   Received from [facility]   Social Network    Social Network: Not on file     Family History: The patient's family history includes Cancer in his father and mother; Hypertension in his sister.  ROS:   Please see the history  of present illness.    All other systems reviewed and are negative.  EKGs/Labs/Other Studies Reviewed:         Recent Labs: No results found for requested labs within last 365 days.   Recent Lipid Panel No results found for: "CHOL", "TRIG", "HDL", "CHOLHDL", "VLDL", "LDLCALC", "LDLDIRECT"  Physical Exam:   VS:  BP (!) 150/106 (BP Location: Right Arm, Patient Position: Sitting, Cuff Size: Large)   Pulse (!) 54   Ht 6\' 3"  (1.905 m)   Wt 290 lb (131.5 kg)   BMI 36.25 kg/m  , BMI Body mass index is 36.25 kg/m. GENERAL:  Well appearing HEENT: Pupils equal round and reactive, fundi not visualized, oral mucosa unremarkable NECK:  No jugular venous distention, waveform within normal limits, carotid upstroke brisk and symmetric, no bruits, no thyromegaly LYMPHATICS:  No cervical adenopathy LUNGS:  Clear to auscultation bilaterally HEART:  RRR.  PMI not displaced or sustained,S1 and S2 within normal limits, no S3, no S4, no clicks, no rubs, no murmurs ABD:  Flat, positive bowel sounds normal in frequency in pitch, no bruits, no rebound, no guarding, no midline  pulsatile mass, no hepatomegaly, no splenomegaly EXT:  2 plus pulses throughout, no edema, no cyanosis no clubbing SKIN:  No rashes no nodules NEURO:  Cranial nerves II through XII grossly intact, motor grossly intact throughout PSYCH:  Cognitively intact, oriented to person place and time   ASSESSMENT/PLAN:    HTN - BP not at goal <130/80. Stop Losartan . Start Valsartan  320mg  daily MyChart message in 1 week to check in.  If BP controlled we will plan to decrease dose of clonidine  with goal to gradually discontinue as contributory to bradycardia, fatigue. For simplification of regimen in future: consider combination tablets, consider transition from hydrochlorothiazide  to spironolactone. Also discussed consideration of low dose Amlodipine as less likely to cause edema.  Labs in 2 weeks: BMP, renin-aldosterone, catecholamines, metanephrines, TSH for monitoring of renal function after change as well as secondary hypertension workup. Consider renal artery duplex at follow-up if BP remains difficult to control.  Hypokalemia - Corrected with potassium 20mEq daily. Likely due to Hydrochlorothiazide . Could consider transition from hydrochlorothiazide  to Spironolactone in future visits.   Sinus bradycardia - Prior monitor 10/2022 with sinus bradycardia but no significant arrhythmia. Metoprolol and labetolol since discontinued. Of note, bradycardia was asymptomatic. Echo 2023 normal LVEF, mild LVH, mild dilation ascending aorta 42mm, trace MR/TR.   OSA - Wears CPAP regularly. Continued CPAP compliance encouraged.   HLD / Hypertriglyceridemia - Labs via Care Everywhere 06/25/22: Total cholesterol 172, triglycerides 271, hdl 37, ldl 81. Previously intolerant to Rosuvastatin. Tolerates Pravastatin  without issue. Recommend triglyceride lowering lifestyle changes: limit carbohydrates (alcohol/bread/sugars) and continue to increase physical activity.   Screening for Secondary Hypertension:     09/11/2023     4:45 PM  Causes  Drugs/Herbals N/A     - Comments No OTC agents  Sleep Apnea Screened     - Comments treated with CPAP  Thyroid  Disease Screened  Hyperaldosteronism Screened  Pheochromocytoma Screened  Hyperparathyroidism Screened  Coarctation of the Aorta N/A     - Comments BP symmetrical    Relevant Labs/Studies:    Latest Ref Rng & Units 04/07/2009   10:39 AM  Basic Labs  Sodium 135 - 145 mEq/L 140   Potassium 3.5 - 5.1 mEq/L 4.3   Creatinine 0.4 - 1.5 mg/dL 1.61  Disposition:    FU with MD/APP/PharmD in 2 months    Medication Adjustments/Labs and Tests Ordered: Current medicines are reviewed at length with the patient today.  Concerns regarding medicines are outlined above.  Orders Placed This Encounter  Procedures   Basic metabolic panel with GFR   Metanephrines, plasma   Catecholamines, fractionated, plasma   TSH   Aldosterone + renin activity w/ ratio   EKG 12-Lead   Meds ordered this encounter  Medications   valsartan  (DIOVAN ) 320 MG tablet    Sig: Take 1 tablet (320 mg total) by mouth daily.    Dispense:  30 tablet    Refill:  [id number]     Signed, Clearnce Curia, NP  09/11/2023 4:46 PM    [facility]

## 2023-09-11 NOTE — Patient Instructions (Addendum)
 Medication Instructions:  Our goal is to get you off the Clonidine .   Our other goal is to simplify your medication regimen and get you on some combination tablets.   Stop: Losartan    Start: Valsartan  320mg  daily    Labwork: Your physician recommends that you return for lab work in two weeks- BMP, Renin-Aldosterone, Catecholamines, Metanephrines, and TSH   Follow-Up: Please follow up in 2 months in [facility] with Dr. Theodis Fiscal, Neomi Banks, NP or Donivan Furry PharmD    Special Instructions:  We will check in on BP in 1 week

## 2023-09-12 ENCOUNTER — Other Ambulatory Visit (HOSPITAL_COMMUNITY): Payer: Self-pay

## 2023-09-18 ENCOUNTER — Encounter (HOSPITAL_BASED_OUTPATIENT_CLINIC_OR_DEPARTMENT_OTHER): Payer: Self-pay

## 2023-09-19 NOTE — Telephone Encounter (Signed)
Bp log updated

## 2023-09-26 DIAGNOSIS — I10 Essential (primary) hypertension: Secondary | ICD-10-CM | POA: Diagnosis not present

## 2023-10-03 ENCOUNTER — Ambulatory Visit (HOSPITAL_BASED_OUTPATIENT_CLINIC_OR_DEPARTMENT_OTHER): Payer: Self-pay | Admitting: Family

## 2023-10-03 ENCOUNTER — Encounter (HOSPITAL_BASED_OUTPATIENT_CLINIC_OR_DEPARTMENT_OTHER): Payer: Self-pay

## 2023-10-03 DIAGNOSIS — I10 Essential (primary) hypertension: Secondary | ICD-10-CM

## 2023-10-03 LAB — BASIC METABOLIC PANEL WITH GFR
BUN/Creatinine Ratio: 15 (ref 10–24)
BUN: 18 mg/dL (ref 8–27)
CO2: 25 mmol/L (ref 20–29)
Calcium: 9.4 mg/dL (ref 8.6–10.2)
Chloride: 104 mmol/L (ref 96–106)
Creatinine, Ser: 1.17 mg/dL (ref 0.76–1.27)
Glucose: 100 mg/dL — ABNORMAL HIGH (ref 70–99)
Potassium: 4.4 mmol/L (ref 3.5–5.2)
Sodium: 141 mmol/L (ref 134–144)
eGFR: 71 mL/min/{1.73_m2} (ref >59)

## 2023-10-03 LAB — TSH: TSH: 2.14 u[IU]/mL (ref 0.450–4.500)

## 2023-10-03 LAB — CATECHOLAMINES, FRACTIONATED, PLASMA
Dopamine: <10 pg/mL (ref 0.0–36.7)
Epinephrine: 24.9 pg/mL (ref 0.0–55.4)
Norepinephrine: 150 pg/mL (ref 115–524)

## 2023-10-03 LAB — ALDOSTERONE + RENIN ACTIVITY W/ RATIO
Aldos/Renin Ratio: >91.6 — ABNORMAL HIGH (ref 0.0–30.0)
Aldosterone: 15.3 ng/dL (ref 0.0–30.0)
Renin Activity, Plasma: <0.167 ng/mL/h — ABNORMAL LOW (ref 0.167–5.380)

## 2023-10-03 LAB — METANEPHRINES, PLASMA
Metanephrine, Free: <25 pg/mL (ref 0.0–88.0)
Normetanephrine, Free: 37.4 pg/mL (ref 0.0–244.0)

## 2023-10-03 NOTE — Telephone Encounter (Signed)
 Left message for patient to call back     Instructions for Salt Loading:   Please salt load with sodium chloride  tablet 2g three times per day for 3 days.  On the third day get a blood work (BMP)  and 24-hour urine specimen    For example schedule: Tuesday sodium chloride  tablet with breakfast, lunch, and dinner Wednesday sodium chloride  tablet with breakfast, lunch, and dinner Thursday sodium chloride  tablet with breakfast, go get labs, get urine collection container to start (must keep refrigerated), then take sodium chloride  tablet with lunch & dinner  Order:  Urine sodium 24, creatinine cl 24, renin-aldo ( the one that doesn't say 24 hr is the [facility] one)  Rx is sodium tablets- 18 tabs with zero refills   ** will need BMP on day 3, order this separate- so that it will be on different lab sheet than urine labs**

## 2023-10-03 NOTE — Telephone Encounter (Signed)
BP log as requested 

## 2023-10-06 NOTE — Telephone Encounter (Signed)
Updated medication list as requested.

## 2023-10-06 NOTE — Addendum Note (Signed)
 Addended by: Guss Legacy on: 10/06/2023 07:09 AM   Modules accepted: Orders

## 2023-10-09 MED ORDER — SODIUM CHLORIDE 1 G PO TABS: 2.0000 g | ORAL_TABLET | Freq: Three times a day (TID) | ORAL | 0 refills | Status: AC

## 2023-10-09 NOTE — Addendum Note (Signed)
 Addended by: Zorita Hiss on: 10/09/2023 08:26 AM   Modules accepted: Orders

## 2023-10-10 ENCOUNTER — Other Ambulatory Visit (HOSPITAL_COMMUNITY): Payer: Self-pay

## 2023-10-10 ENCOUNTER — Other Ambulatory Visit: Payer: Self-pay

## 2023-10-15 DIAGNOSIS — R739 Hyperglycemia, unspecified: Secondary | ICD-10-CM | POA: Diagnosis not present

## 2023-10-15 DIAGNOSIS — I1A0 Resistant hypertension: Secondary | ICD-10-CM | POA: Diagnosis not present

## 2023-10-15 DIAGNOSIS — E782 Mixed hyperlipidemia: Secondary | ICD-10-CM | POA: Diagnosis not present

## 2023-10-16 ENCOUNTER — Other Ambulatory Visit (HOSPITAL_COMMUNITY): Payer: Self-pay | Admitting: Internal Medicine

## 2023-10-16 DIAGNOSIS — I10 Essential (primary) hypertension: Secondary | ICD-10-CM | POA: Diagnosis not present

## 2023-10-16 DIAGNOSIS — I83892 Varicose veins of left lower extremities with other complications: Secondary | ICD-10-CM | POA: Diagnosis not present

## 2023-10-17 DIAGNOSIS — N400 Enlarged prostate without lower urinary tract symptoms: Secondary | ICD-10-CM | POA: Diagnosis not present

## 2023-10-17 DIAGNOSIS — Z0000 Encounter for general adult medical examination without abnormal findings: Secondary | ICD-10-CM | POA: Diagnosis not present

## 2023-10-17 DIAGNOSIS — E66812 Obesity, class 2: Secondary | ICD-10-CM | POA: Diagnosis not present

## 2023-10-17 DIAGNOSIS — N529 Male erectile dysfunction, unspecified: Secondary | ICD-10-CM | POA: Diagnosis not present

## 2023-10-17 DIAGNOSIS — R001 Bradycardia, unspecified: Secondary | ICD-10-CM | POA: Diagnosis not present

## 2023-10-17 LAB — BASIC METABOLIC PANEL WITH GFR
BUN/Creatinine Ratio: 16 (ref 10–24)
BUN: 15 mg/dL (ref 8–27)
CO2: 21 mmol/L (ref 20–29)
Calcium: 8.8 mg/dL (ref 8.6–10.2)
Chloride: 105 mmol/L (ref 96–106)
Creatinine, Ser: 0.95 mg/dL (ref 0.76–1.27)
Glucose: 112 mg/dL — ABNORMAL HIGH (ref 70–99)
Potassium: 4 mmol/L (ref 3.5–5.2)
Sodium: 143 mmol/L (ref 134–144)
eGFR: 92 mL/min/{1.73_m2} (ref >59)

## 2023-10-17 MED ORDER — DOXAZOSIN MESYLATE 4 MG PO TABS: 4.0000 mg | ORAL_TABLET | Freq: Two times a day (BID) | ORAL | 3 refills | Status: DC

## 2023-10-17 MED ORDER — CLONIDINE HCL 0.1 MG PO TABS: 0.1000 mg | ORAL_TABLET | Freq: Two times a day (BID) | ORAL | 2 refills | Status: DC

## 2023-10-17 MED ORDER — HYDROCHLOROTHIAZIDE 50 MG PO TABS: 50.0000 mg | ORAL_TABLET | Freq: Every day | ORAL | 3 refills | Status: AC

## 2023-10-17 NOTE — Addendum Note (Signed)
 Addended by: Guss Legacy on: 10/17/2023 02:03 PM   Modules accepted: Orders

## 2023-10-28 LAB — ALDOSTERONE, URINE

## 2023-10-30 ENCOUNTER — Encounter (HOSPITAL_BASED_OUTPATIENT_CLINIC_OR_DEPARTMENT_OTHER): Payer: Self-pay

## 2023-11-04 NOTE — Telephone Encounter (Signed)
BP log as requested 

## 2023-11-07 MED ORDER — DOXAZOSIN MESYLATE 4 MG PO TABS
6.0000 mg | ORAL_TABLET | Freq: Two times a day (BID) | ORAL | 3 refills | Status: AC
Start: 2023-11-07 — End: ?

## 2023-11-07 NOTE — Addendum Note (Signed)
 Addended by: Guss Legacy on: 11/07/2023 07:09 AM   Modules accepted: Orders

## 2023-11-18 NOTE — Telephone Encounter (Signed)
BP log as requested 

## 2023-11-19 ENCOUNTER — Ambulatory Visit (HOSPITAL_BASED_OUTPATIENT_CLINIC_OR_DEPARTMENT_OTHER): Admitting: Family

## 2023-11-19 ENCOUNTER — Encounter (HOSPITAL_BASED_OUTPATIENT_CLINIC_OR_DEPARTMENT_OTHER): Payer: Self-pay | Admitting: Family

## 2023-11-19 VITALS — BP 160/88 | HR 66 | Ht 75.0 in | Wt 297.0 lb

## 2023-11-19 DIAGNOSIS — G4733 Obstructive sleep apnea (adult) (pediatric): Secondary | ICD-10-CM

## 2023-11-19 DIAGNOSIS — I1A0 Resistant hypertension: Secondary | ICD-10-CM | POA: Diagnosis not present

## 2023-11-19 DIAGNOSIS — I10 Essential (primary) hypertension: Secondary | ICD-10-CM

## 2023-11-19 MED ORDER — SPIRONOLACTONE 25 MG PO TABS: 12.5000 mg | ORAL_TABLET | Freq: Every day | ORAL | 3 refills | Status: AC

## 2023-11-19 NOTE — Progress Notes (Signed)
 Advanced Hypertension Clinic Assessment:    Date:  11/28/2023   ID:  Richard Farley, DOB 06/25/1960, MRN [id number]  PCP:  Godwin Shed, MD  Cardiologist:  None  Nephrologist:  Referring MD: Godwin Shed, MD   CC: Hypertension  History of Present Illness:    Richard Farley is a 63 y.o. male with a hx of HTN, bradycardia, BPH, OSA here to follow up in the [facility] Hypertension Clinic.   Prior echo 08/01/2021 normal LVEF 60 to 65%, mild LVH, RV mildly dilated with normal function, bilateral atria mildly dilated, tricuspid aortic valve with no stenosis no regurgitation, trace MR/TR, mildly dilated ascending aorta 42mm.  Previously evaluated by [facility] cardiology 08/2022 after referral from primary care provider for bradycardia.  Zio patch 08/2022 predominantly normal sinus rhythm with average heart rate 59 bpm, first-degree AV block present, 1 run of SVT lasting 6 beats with a max rate of 133 bpm which was asymptomatic.  Toprol was decreased from 100 mg to 25 mg daily.  His bradycardia was asymptomatic.  Discussed the use of AI scribe software for clinical note transcription with the patient, who gave verbal consent to proceed.  History of Present Illness Established with [facility]pertension Clinic 09/11/23. Prior intolerance to Amlodipine 10mg  with LE edema (lower dose never trialed. No tobacco use and drinking alcohol 4-5 beers per week. Endorsed following a low sodium diet. He had persistent hypertension despite multiple medications and felt profoundly fatigued on Clonidine . Losartan  changed to Valsartan  320mg  daily. 09/26/23 TSH normal and catecholamines/metanephrines with no evidence of pheochromocytoma. Plasma renin-aldosterone indeterminate for hyperaldosteronism (aldosterone 15.3, renin <0.167, aldos/renin ratio >91.6). Via subsequent MyChart messages, Clonidine  reduced. Doxazosin  initiated and up-titrated.  Shi Blankenship is a 63 year old  male with hypertension who presents for blood pressure management and evaluation of swelling. 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   Blood pressure readings at home fluctuate between 118/84 and 164/91. He experiences occasional lightheadedness with activity but no near syncope, syncope. Current medications include valsartan  and hydrochlorothiazide  in the morning, and doxazosin  twice daily, with difficulty in splitting the doxazosin  tablets evenly.  Ankle swelling worsens in the heat. Compression socks provide some relief but are uncomfortable in summer. He is receiving treatment for varicose veins with laser ablation, noting some improvement.  Previous EKGs were abnormal, but a monitor showed no concerning findings per his report. No family history of heart attacks or heart disease. Reports no shortness of breath nor dyspnea on exertion. Reports no chest pain, pressure, or tightness. No  orthopnea, PND. Reports no palpitations.    He experiences ongoing stress related to work and personal life, describing it as 'everyday stress'. Energy levels remain low since discontinuing clonidine , which he attributes to lack of rest.  10/15/23 labs with PCP (non fasting) total cholesterol 161, triglycerides 406, HDL 38, LDL 82.    Previous antihypertensives: Amlodipine - swelling on 10mg  dose, lower dose never trialed Metoprolol - bradycardia Labetolol - bradycardia   Past Medical History:  Diagnosis Date   Hyperlipidemia    Hypertension    Sleep apnea    CPAP    Past Surgical History:  Procedure Laterality Date   HERNIA REPAIR     OTOPLASTY     TYMPANOSTOMY TUBE PLACEMENT Right 01/13/2017   WISDOM TOOTH EXTRACTION      Current Medications: Current Meds  Medication Sig   cholecalciferol (VITAMIN D3) 25 MCG (1000 UNIT) tablet Take 1,000  Units by mouth daily.   Cinnamon 500 MG TABS Take 500 mg by mouth daily.   doxazosin  (CARDURA ) 4 MG tablet Take 1.5 tablets (6 mg total)  by mouth 2 (two) times daily.   hydrochlorothiazide  (HYDRODIURIL ) 50 MG tablet Take 1 tablet (50 mg total) by mouth daily.   Multiple Vitamins-Minerals (MULTIVITAMIN WITH MINERALS) tablet Take 1 tablet by mouth daily.   NONFORMULARY OR COMPOUNDED ITEM Apply 1 application topically daily. Testosterone  Cream   pravastatin  (PRAVACHOL ) 40 MG tablet Take 1 tablet (40 mg total) by mouth daily.   spironolactone  (ALDACTONE ) 25 MG tablet Take 0.5 tablets (12.5 mg total) by mouth daily.   valsartan  (DIOVAN ) 320 MG tablet Take 1 tablet (320 mg total) by mouth daily.   [DISCONTINUED] potassium chloride  SA (KLOR-CON  M) 20 MEQ tablet Take 1 tablet (20 mEq total) by mouth daily.     Allergies:   Crestor [rosuvastatin calcium]   Social History   Socioeconomic History   Marital status: Married    Spouse name: Not on file   Number of children: Not on file   Years of education: Not on file   Highest education level: Not on file  Occupational History   Not on file  Tobacco Use   Smoking status: Never   Smokeless tobacco: Never  Vaping Use   Vaping status: Never Used  Substance and Sexual Activity   Alcohol use: Yes    Comment: OCCAS   Drug use: Never   Sexual activity: Not on file  Other Topics Concern   Not on file  Social History Narrative   Not on file   Social Drivers of Health   Financial Resource Strain: Not on file  Food Insecurity: Low Risk  (10/17/2023)   Received from [facility]   Hunger Vital Sign    Within the past 12 months, you worried that your food would run out before you got money to buy more: Never true    Within the past 12 months, the food you bought just didn't last and you didn't have money to get more. : Never true  Transportation Needs: No Transportation Needs (10/17/2023)   Received from [facility]    In the past 12 months, has lack of reliable transportation kept you from medical appointments, meetings, work or from getting things needed for  daily living? : No  Physical Activity: Not on file  Stress: Not on file  Social Connections: Unknown (10/02/2021)   [facility]   Social Network    Social Network: Not on file     Family History: The patient's family history includes Cancer in his father and mother; Hypertension in his sister.  ROS:   Please see the history of present illness.    All other systems reviewed and are negative.  EKGs/Labs/Other Studies Reviewed:    EKG Interpretation Date/Time:  Wednesday November 19 2023 15:11:10 EDT Ventricular Rate:  66 PR Interval:  190 QRS Duration:  134 QT Interval:  412 QTC Calculation: 431 R Axis:   240  Text Interpretation: Normal sinus rhythm Right superior axis deviation Non-specific intra-ventricular conduction block Nonspecific T wave abnormality Confirmed by Vannie Mora ([id number]) on 11/28/2023 9:40:12 PM    Recent Labs: 09/26/2023: TSH 2.140 10/16/2023: BUN 15; Creatinine, Ser 0.95; Potassium 4.0; Sodium 143   Recent Lipid Panel No results found for: CHOL, TRIG, HDL, CHOLHDL, VLDL, LDLCALC, LDLDIRECT  Physical Exam:   VS:  BP (!) 160/88   Pulse 66  Ht 6' 3 (1.905 m)   Wt 297 lb (134.7 kg)   SpO2 98%   BMI 37.12 kg/m  , BMI Body mass index is 37.12 kg/m.  Vitals:   11/19/23 1502 11/19/23 1549  BP: (!) 180/110 (!) 160/88  Pulse: 66   Height: 6' 3 (1.905 m)   Weight: 297 lb (134.7 kg)   SpO2: 98%   BMI (Calculated): 37.12     GENERAL:  Well appearing HEENT: Pupils equal round and reactive, fundi not visualized, oral mucosa unremarkable NECK:  No jugular venous distention, waveform within normal limits, carotid upstroke brisk and symmetric, no bruits, no thyromegaly LYMPHATICS:  No cervical adenopathy LUNGS:  Clear to auscultation bilaterally HEART:  RRR.  PMI not displaced or sustained,S1 and S2 within normal limits, no S3, no S4, no clicks, no rubs, no murmurs ABD:  Flat, positive bowel sounds normal in frequency in  pitch, no bruits, no rebound, no guarding, no midline pulsatile mass, no hepatomegaly, no splenomegaly EXT:  2 plus pulses throughout, no edema, no cyanosis no clubbing SKIN:  No rashes no nodules NEURO:  Cranial nerves II through XII grossly intact, motor grossly intact throughout PSYCH:  Cognitively intact, oriented to person place and time   ASSESSMENT/PLAN:    HTN / Hypokalemia - BP not at goal <130/80.Average SBP at home over last 8 readings of 141. BP in clinic initiatlly 180/110 with repeat 160/88 without intervention. He will ask wife who is a nurse to check accuracy of home BP cuff.  Continue Valsartan  320mg  daily, Doxazosin  6mg  bID. Hydrochlorothiazide  50mg  daily.  Start Spironolactone  12.5mg  daily. Stop potassium. BMP in 1-2 weeks.  Catecholamines, metanephrines, TSH 09/2023 unremarkable. 09/2023 labs indeterminate for hyperaldosteronism. Discussed trial of low dose Spironolactone  versus repeating salt loading and 24h urine labs. He prefers to trial Spironolacotne. If good BP response, could up-titrate dose and reduce/discontinue hydrochlorothiazide  vs doxazosin .  Consider renal artery duplex at follow-up if BP remains difficult to control.  Sinus bradycardia - Prior monitor 10/2022 with sinus bradycardia but no significant arrhythmia. Metoprolol and labetolol since discontinued. Of note, bradycardia was asymptomatic. Echo 2023 normal LVEF, mild LVH, mild dilation ascending aorta 42mm, trace MR/TR.   OSA - Wears CPAP regularly. Continued CPAP compliance encouraged.   HLD / Hypertriglyceridemia - Labs via Care Everywhere 06/25/22: Total cholesterol 172, triglycerides 271, hdl 37, ldl 81. Lipid panel 09/2023 was not fasting, trilycerides inaccurate. LPreviously intolerant to Rosuvastatin. Tolerates Pravastatin  without issue. Recommend triglyceride lowering lifestyle changes: limit carbohydrates (alcohol/bread/sugars) and continue to increase physical activity. Discussed addition of OTC fish  oil for lowering of triglycerides.   Screening for Secondary Hypertension:     09/11/2023    4:45 PM  Causes  Drugs/Herbals N/A     - Comments No OTC agents  Sleep Apnea Screened     - Comments treated with CPAP  Thyroid  Disease Screened  Hyperaldosteronism Screened  Pheochromocytoma Screened  Hyperparathyroidism Screened  Coarctation of the Aorta N/A     - Comments BP symmetrical    Relevant Labs/Studies:    Latest Ref Rng & Units 10/16/2023   11:26 AM 09/26/2023    4:08 PM 04/07/2009   10:39 AM  Basic Labs  Sodium 134 - 144 mmol/L 143  141  140   Potassium 3.5 - 5.2 mmol/L 4.0  4.4  4.3   Creatinine 0.76 - 1.27 mg/dL 9.04  8.82  8.93        Latest Ref Rng & Units 09/26/2023    4:08  PM  Thyroid    TSH 0.450 - 4.500 uIU/mL 2.140        Latest Ref Rng & Units 09/26/2023    4:08 PM  Renin/Aldosterone   Aldosterone 0.0 - 30.0 ng/dL 84.6   Aldos/Renin Ratio 0.0 - 30.0 >91.6        Latest Ref Rng & Units 09/26/2023    4:08 PM  Metanephrines/Catecholamines   Epinephrine 0.0 - 55.4 pg/mL 24.9   Norepinephrine 115 - 524 pg/mL 150   Dopamine 0.0 - 36.7 pg/mL <10.0   Metanephrines 0.0 - 88.0 pg/mL <25.0   Normetanephrines  0.0 - 244.0 pg/mL 37.4             Disposition:    FU with MD/APP/PharmD in 2 months    Medication Adjustments/Labs and Tests Ordered: Current medicines are reviewed at length with the patient today.  Concerns regarding medicines are outlined above.  Orders Placed This Encounter  Procedures   EKG 12-Lead   Meds ordered this encounter  Medications   spironolactone  (ALDACTONE ) 25 MG tablet    Sig: Take 0.5 tablets (12.5 mg total) by mouth daily.    Dispense:  45 tablet    Refill:  3    Supervising Provider:   LONNI SLAIN [[id number]]     Signed, Reche GORMAN Finder, NP  11/28/2023 9:46 PM    [facility]

## 2023-11-19 NOTE — Patient Instructions (Addendum)
 Medication Instructions:  Your physician has recommended you make the following change in your medication:   Stop: potassium   Start: Spirolactone 12.5 mg daily ( half tablet)   Can start OTC Fish Oil 2,000mg  daily    Labwork: BMP in 1-2 weeks   Follow-Up: Please follow up in 2  months in [facility]IN[facility] with Dr. Raford, Reche Finder, NP or Allean Mink PharmD

## 2023-11-19 NOTE — Telephone Encounter (Signed)
 Addressed at clinic visit 11/19/23.   Lataisha Colan S Hakiem Malizia, NP

## 2023-12-04 DIAGNOSIS — D485 Neoplasm of uncertain behavior of skin: Secondary | ICD-10-CM | POA: Diagnosis not present

## 2023-12-04 DIAGNOSIS — L570 Actinic keratosis: Secondary | ICD-10-CM | POA: Diagnosis not present

## 2023-12-04 DIAGNOSIS — D225 Melanocytic nevi of trunk: Secondary | ICD-10-CM | POA: Diagnosis not present

## 2023-12-22 DIAGNOSIS — E291 Testicular hypofunction: Secondary | ICD-10-CM | POA: Diagnosis not present

## 2023-12-22 DIAGNOSIS — R972 Elevated prostate specific antigen [PSA]: Secondary | ICD-10-CM | POA: Diagnosis not present

## 2023-12-31 DIAGNOSIS — R600 Localized edema: Secondary | ICD-10-CM | POA: Diagnosis not present

## 2023-12-31 DIAGNOSIS — I87392 Chronic venous hypertension (idiopathic) with other complications of left lower extremity: Secondary | ICD-10-CM | POA: Diagnosis not present

## 2023-12-31 DIAGNOSIS — I8312 Varicose veins of left lower extremity with inflammation: Secondary | ICD-10-CM | POA: Diagnosis not present

## 2023-12-31 DIAGNOSIS — I8311 Varicose veins of right lower extremity with inflammation: Secondary | ICD-10-CM | POA: Diagnosis not present

## 2023-12-31 DIAGNOSIS — I83892 Varicose veins of left lower extremities with other complications: Secondary | ICD-10-CM | POA: Diagnosis not present

## 2024-01-11 ENCOUNTER — Other Ambulatory Visit (HOSPITAL_BASED_OUTPATIENT_CLINIC_OR_DEPARTMENT_OTHER): Payer: Self-pay | Admitting: Family

## 2024-01-13 ENCOUNTER — Other Ambulatory Visit: Payer: Self-pay

## 2024-01-26 DIAGNOSIS — R17 Unspecified jaundice: Secondary | ICD-10-CM | POA: Diagnosis not present

## 2024-01-26 DIAGNOSIS — E782 Mixed hyperlipidemia: Secondary | ICD-10-CM | POA: Diagnosis not present

## 2024-01-26 DIAGNOSIS — R972 Elevated prostate specific antigen [PSA]: Secondary | ICD-10-CM | POA: Diagnosis not present

## 2024-01-26 DIAGNOSIS — I1A0 Resistant hypertension: Secondary | ICD-10-CM | POA: Diagnosis not present

## 2024-02-02 DIAGNOSIS — I83892 Varicose veins of left lower extremities with other complications: Secondary | ICD-10-CM | POA: Diagnosis not present

## 2024-02-18 DIAGNOSIS — R972 Elevated prostate specific antigen [PSA]: Secondary | ICD-10-CM | POA: Diagnosis not present

## 2024-02-18 DIAGNOSIS — E291 Testicular hypofunction: Secondary | ICD-10-CM | POA: Diagnosis not present

## 2024-02-18 DIAGNOSIS — N401 Enlarged prostate with lower urinary tract symptoms: Secondary | ICD-10-CM | POA: Diagnosis not present

## 2024-02-18 DIAGNOSIS — N138 Other obstructive and reflux uropathy: Secondary | ICD-10-CM | POA: Diagnosis not present

## 2024-05-31 ENCOUNTER — Encounter (HOSPITAL_BASED_OUTPATIENT_CLINIC_OR_DEPARTMENT_OTHER): Payer: Self-pay | Admitting: Family Medicine

## 2024-05-31 ENCOUNTER — Ambulatory Visit (HOSPITAL_BASED_OUTPATIENT_CLINIC_OR_DEPARTMENT_OTHER): Admitting: Family Medicine

## 2024-05-31 VITALS — BP 139/73 | HR 70 | Resp 18 | Ht 75.0 in | Wt 296.0 lb

## 2024-05-31 DIAGNOSIS — I10 Essential (primary) hypertension: Secondary | ICD-10-CM | POA: Insufficient documentation

## 2024-05-31 DIAGNOSIS — Z0000 Encounter for general adult medical examination without abnormal findings: Secondary | ICD-10-CM

## 2024-05-31 DIAGNOSIS — E559 Vitamin D deficiency, unspecified: Secondary | ICD-10-CM

## 2024-05-31 DIAGNOSIS — Z600 Problems of adjustment to life-cycle transitions: Secondary | ICD-10-CM

## 2024-05-31 DIAGNOSIS — Z1211 Encounter for screening for malignant neoplasm of colon: Secondary | ICD-10-CM

## 2024-05-31 NOTE — Progress Notes (Unsigned)
 "  New Patient Office Visit  Subjective   Patient ID: Richard Farley, male    DOB: [birth year]  Age: 64 y.o. MRN: [id number]  CC:  Chief Complaint  Patient presents with   Establish Care   Depression    HPI [name] presents to establish care Last PCP - Dr. Godwin  Discussed the use of AI scribe software for clinical note transcription with the patient, who gave verbal consent to proceed.  History of Present Illness Richard Farley is a 64 year old male who presents with concerns about depression and weight management.  He is experiencing symptoms of depression, which he attributes to his brother's recent diagnosis of stage four cancer. His brother was diagnosed in July after initially suspecting pneumonia due to persistent coughing. He has not had significant issues with depression in the past and has not received any specific treatments or medications for depression before.  He is concerned about weight management and inquires about the appropriateness of using medications or injections for weight loss. He has not tried any extreme diets or exercise regimens in the past. He and his wife have made a New Year's resolution to focus on weight loss. 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  He has a history of elevated PSA levels, which he attributes to taking supplements from a homeopathic practitioner. He was previously on medication for this but has since stopped. He is currently using testosterone  cream prescribed by his urologist at [facility].  He is on several medications for hypertension, including valsartan , doxazosin , hydrochlorothiazide , and pravastatin . He mentions seeing Caitlin for his blood pressure management and is unsure if he needs to reschedule with her.  He has a family history of cancer, with both parents having passed away from lung cancer. He is concerned about cancer  screening, particularly in light of his brother's diagnosis, and has questions about the availability of tests for kidney cancer.  Primary concern related to his brother's recent diagnosis with advanced cancer.  Patient is originally from [place], [place]. 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 Leases the land out to horses. 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  Outpatient Encounter Medications as of 05/31/2024  Medication Sig   cholecalciferol (VITAMIN D3) 25 MCG (1000 UNIT) tablet Take 1,000 Units by mouth daily.   Cinnamon 500 MG TABS Take 500 mg by mouth daily.   doxazosin  (CARDURA ) 4 MG tablet Take 1.5 tablets (6 mg total) by mouth 2 (two) times daily.   hydrochlorothiazide  (HYDRODIURIL ) 50 MG tablet Take 1 tablet (50 mg total) by mouth daily.   Multiple Vitamins-Minerals (MULTIVITAMIN WITH MINERALS) tablet Take 1 tablet by mouth daily.   pravastatin  (PRAVACHOL ) 40 MG tablet Take 1 tablet (40 mg total) by mouth daily.   spironolactone  (ALDACTONE ) 25 MG tablet Take 0.5 tablets (12.5 mg total) by mouth daily.   valsartan  (DIOVAN ) 320 MG tablet Take 1 tablet (320 mg total) by mouth daily.   NONFORMULARY OR COMPOUNDED ITEM Apply 1 application topically daily. Testosterone  Cream   [DISCONTINUED] labetalol  (NORMODYNE ) 200 MG tablet Take 1 tablet (200 mg total) by mouth 2 (two) times daily.   No facility-administered encounter medications on file as of 05/31/2024.    Past Medical History:  Diagnosis Date   Hyperlipidemia    Hypertension    Sleep apnea  CPAP    Past Surgical History:  Procedure Laterality Date   HERNIA REPAIR     OTOPLASTY     TYMPANOSTOMY TUBE PLACEMENT Right 01/13/2017   WISDOM TOOTH EXTRACTION      Family History  Problem Relation Age of Onset   Cancer Mother    Cancer Father    Hypertension Sister     Social History   Socioeconomic History    Marital status: Married    Spouse name: Not on file   Number of children: Not on file   Years of education: Not on file   Highest education level: Not on file  Occupational History   Not on file  Tobacco Use   Smoking status: Never    Passive exposure: Never   Smokeless tobacco: Never  Vaping Use   Vaping status: Never Used  Substance and Sexual Activity   Alcohol use: Yes    Comment: OCCAS   Drug use: Never   Sexual activity: Not on file  Other Topics Concern   Not on file  Social History Narrative   Not on file   Social Drivers of Health   Tobacco Use: Low Risk (05/31/2024)   Patient History    Smoking Tobacco Use: Never    Smokeless Tobacco Use: Never    Passive Exposure: Never  Financial Resource Strain: Not on file  Food Insecurity: Low Risk (10/17/2023)   Received from [facility]   Epic    Within the past 12 months, you worried that your food would run out before you got money to buy more: Never true    Within the past 12 months, the food you bought just didn't last and you didn't have money to get more. : Never true  Transportation Needs: No Transportation Needs (10/17/2023)   Received from [facility]    In the past 12 months, has lack of reliable transportation kept you from medical appointments, meetings, work or from getting things needed for daily living? : No  Physical Activity: Not on file  Stress: Not on file  Social Connections: Unknown (10/02/2021)   Received from [facility]   Social Network    Social Network: Not on file  Intimate Partner Violence: Unknown (08/24/2021)   Received from [facility]   HITS    Physically Hurt: Not on file    Insult or Talk Down To: Not on file    Threaten Physical Harm: Not on file    Scream or Curse: Not on file  Depression (EYV7-0): Not on file  Alcohol Screen: Not on file  Housing: Low Risk (10/17/2023)   Received from [facility]   Epic    What is your living  situation today?: I have a steady place to live    Think about the place you live. Do you have problems with any of the following? Choose all that apply:: None/None on this list  Utilities: Low Risk (10/17/2023)   Received from [facility]   Utilities    In the past 12 months has the electric, gas, oil, or water company threatened to shut off services in your home? : No  Health Literacy: Not on file    Objective   BP 139/73 (BP Location: Left Arm, Patient Position: Sitting, Cuff Size: Large)   Pulse 70   Resp 18   Ht 6' 3 (1.905 m)   Wt 296 lb (134.3 kg)   SpO2 98%   BMI 37.00 kg/m   Physical Exam  63  Assessment & Plan:  Assessment and Plan Assessment & Plan Obesity BMI indicates obesity. Discussed lifestyle modifications and potential use of weight loss medications if needed. Explained benefits and side effects of injectable medications like Wegovy and Zepbound. Insurance may affect medication choice. - Implement lifestyle modifications including exercise and dietary changes. - Consider weight loss medications if lifestyle modifications are insufficient after three months. - [facility]erred to [facility] for additional support.  Primary hypertension Hypertension management discussed. Current medications include valsartan , hydrochlorothiazide , and pravastatin . Weight loss may improve blood pressure control. - Continue current antihypertensive medications.  Depressive symptoms related to life stressors Depressive symptoms likely related to family stressors. Discussed non-pharmacological options. He expressed interest in therapy. Explained benefits of therapy without medication side effects. - Referred to [facility] for counseling or therapy. - Monitor symptoms and consider medication if therapy is insufficient.  General Health Maintenance Discussed colonoscopy scheduling and referral to GI specialist. Emphasized importance of regular screenings and  balancing benefits and risks of cancer screening. - Referred to GI specialist for colonoscopy scheduling. - Scheduled annual physical exam around May, with labs a week prior.   There are no diagnoses linked to this encounter.No follow-ups on file.    ___________________________________________ Kingston Shawgo de [place], MD, ABFM, [facility] "

## 2024-09-30 ENCOUNTER — Encounter (HOSPITAL_BASED_OUTPATIENT_CLINIC_OR_DEPARTMENT_OTHER): Admitting: Family Medicine
